# Patient Record
Sex: Female | Born: 1970
Health system: Southern US, Community
[De-identification: ages and names within clinical notes are randomized; demographics above are authoritative.]

## PROBLEM LIST (undated history)

## (undated) DIAGNOSIS — R7303 Prediabetes: Secondary | ICD-10-CM

## (undated) DIAGNOSIS — I10 Essential (primary) hypertension: Secondary | ICD-10-CM

## (undated) DIAGNOSIS — M1990 Unspecified osteoarthritis, unspecified site: Secondary | ICD-10-CM

## (undated) DIAGNOSIS — F32A Depression, unspecified: Secondary | ICD-10-CM

## (undated) DIAGNOSIS — E785 Hyperlipidemia, unspecified: Secondary | ICD-10-CM

## (undated) DIAGNOSIS — T7840XA Allergy, unspecified, initial encounter: Secondary | ICD-10-CM

## (undated) HISTORY — PX: CARPAL TUNNEL RELEASE: SHX101

## (undated) HISTORY — DX: Allergy, unspecified, initial encounter: T78.40XA

## (undated) HISTORY — PX: COLONOSCOPY: SHX174

## (undated) HISTORY — PX: UPPER GASTROINTESTINAL ENDOSCOPY: SHX188

## (undated) HISTORY — DX: Depression, unspecified: F32.A

## (undated) HISTORY — DX: Hyperlipidemia, unspecified: E78.5

## (undated) HISTORY — PX: ABDOMINAL HYSTERECTOMY: SHX81

## (undated) HISTORY — PX: OTHER SURGICAL HISTORY: SHX169

---

## 2012-01-26 HISTORY — PX: TUBAL LIGATION: SHX77

## 2019-02-14 ENCOUNTER — Ambulatory Visit
Admission: EM | Admit: 2019-02-14 | Discharge: 2019-02-14 | Disposition: A | Discharge status: Home / Self Care | Payer: 59 | Source: Home / Self Care

## 2019-02-14 ENCOUNTER — Encounter (HOSPITAL_COMMUNITY): Payer: Self-pay | Admitting: Emergency Medicine

## 2019-02-14 ENCOUNTER — Other Ambulatory Visit: Payer: Self-pay

## 2019-02-14 ENCOUNTER — Emergency Department (HOSPITAL_COMMUNITY)
Admission: EM | Admit: 2019-02-14 | Discharge: 2019-02-14 | Disposition: A | Discharge status: Home / Self Care | Payer: 59 | Attending: Emergency Medicine | Admitting: Emergency Medicine

## 2019-02-14 DIAGNOSIS — B349 Viral infection, unspecified: Secondary | ICD-10-CM | POA: Diagnosis not present

## 2019-02-14 DIAGNOSIS — M2550 Pain in unspecified joint: Secondary | ICD-10-CM

## 2019-02-14 DIAGNOSIS — M791 Myalgia, unspecified site: Secondary | ICD-10-CM | POA: Diagnosis not present

## 2019-02-14 DIAGNOSIS — R509 Fever, unspecified: Secondary | ICD-10-CM

## 2019-02-14 DIAGNOSIS — R34 Anuria and oliguria: Secondary | ICD-10-CM

## 2019-02-14 DIAGNOSIS — Z79899 Other long term (current) drug therapy: Secondary | ICD-10-CM | POA: Diagnosis not present

## 2019-02-14 DIAGNOSIS — I10 Essential (primary) hypertension: Secondary | ICD-10-CM | POA: Insufficient documentation

## 2019-02-14 DIAGNOSIS — Z20822 Contact with and (suspected) exposure to covid-19: Secondary | ICD-10-CM | POA: Diagnosis not present

## 2019-02-14 DIAGNOSIS — R52 Pain, unspecified: Secondary | ICD-10-CM

## 2019-02-14 HISTORY — DX: Essential (primary) hypertension: I10

## 2019-02-14 LAB — CBC WITH DIFFERENTIAL/PLATELET
Abs Immature Granulocytes: 0.06 10*3/uL (ref 0.00–0.07)
Basophils Absolute: 0.1 10*3/uL (ref 0.0–0.1)
Basophils Relative: 1 %
Eosinophils Absolute: 0 10*3/uL (ref 0.0–0.5)
Eosinophils Relative: 0 %
HCT: 42.3 % (ref 36.0–46.0)
Hemoglobin: 13.7 g/dL (ref 12.0–15.0)
Immature Granulocytes: 1 %
Lymphocytes Relative: 8 %
Lymphs Abs: 0.8 10*3/uL (ref 0.7–4.0)
MCH: 23.9 pg — ABNORMAL LOW (ref 26.0–34.0)
MCHC: 32.4 g/dL (ref 30.0–36.0)
MCV: 73.7 fL — ABNORMAL LOW (ref 80.0–100.0)
Monocytes Absolute: 0.6 10*3/uL (ref 0.1–1.0)
Monocytes Relative: 5 %
Neutro Abs: 9.3 10*3/uL — ABNORMAL HIGH (ref 1.7–7.7)
Neutrophils Relative %: 85 %
Platelets: 253 10*3/uL (ref 150–400)
RBC: 5.74 MIL/uL — ABNORMAL HIGH (ref 3.87–5.11)
RDW: 18.5 % — ABNORMAL HIGH (ref 11.5–15.5)
WBC: 10.8 10*3/uL — ABNORMAL HIGH (ref 4.0–10.5)
nRBC: 0 % (ref 0.0–0.2)

## 2019-02-14 LAB — BASIC METABOLIC PANEL
Anion gap: 11 (ref 5–15)
BUN: 8 mg/dL (ref 6–20)
CO2: 27 mmol/L (ref 22–32)
Calcium: 9.1 mg/dL (ref 8.9–10.3)
Chloride: 97 mmol/L — ABNORMAL LOW (ref 98–111)
Creatinine, Ser: 0.89 mg/dL (ref 0.44–1.00)
GFR calc Af Amer: >60 mL/min (ref >60)
GFR calc non Af Amer: >60 mL/min (ref >60)
Glucose, Bld: 112 mg/dL — ABNORMAL HIGH (ref 70–99)
Potassium: 4.1 mmol/L (ref 3.5–5.1)
Sodium: 135 mmol/L (ref 135–145)

## 2019-02-14 LAB — URINALYSIS, ROUTINE W REFLEX MICROSCOPIC
Bilirubin Urine: NEGATIVE
Glucose, UA: NEGATIVE mg/dL
Hgb urine dipstick: NEGATIVE
Ketones, ur: NEGATIVE mg/dL
Leukocytes,Ua: NEGATIVE
Nitrite: NEGATIVE
Protein, ur: NEGATIVE mg/dL
Specific Gravity, Urine: 1.003 — ABNORMAL LOW (ref 1.005–1.030)
pH: 7 (ref 5.0–8.0)

## 2019-02-14 LAB — POC URINE PREG, ED: Preg Test, Ur: NEGATIVE

## 2019-02-14 LAB — POCT URINALYSIS DIP (MANUAL ENTRY)
Bilirubin, UA: NEGATIVE
Blood, UA: NEGATIVE
Glucose, UA: NEGATIVE mg/dL
Ketones, POC UA: NEGATIVE mg/dL
Leukocytes, UA: NEGATIVE
Nitrite, UA: NEGATIVE
Protein Ur, POC: NEGATIVE mg/dL
Spec Grav, UA: 1.015 (ref 1.010–1.025)
Urobilinogen, UA: 0.2 E.U./dL
pH, UA: 6 (ref 5.0–8.0)

## 2019-02-14 LAB — LIPASE, BLOOD: Lipase: 23 U/L (ref 11–51)

## 2019-02-14 LAB — SARS CORONAVIRUS 2 (TAT 6-24 HRS): SARS Coronavirus 2: NEGATIVE

## 2019-02-14 LAB — LACTIC ACID, PLASMA: Lactic Acid, Venous: 2.3 mmol/L (ref 0.5–1.9)

## 2019-02-14 MED ORDER — ACETAMINOPHEN 325 MG PO TABS
975.0000 mg | ORAL_TABLET | Freq: Once | ORAL | Status: AC
  Administered 2019-02-14: 13:00:00 975 mg via ORAL

## 2019-02-14 MED ORDER — SODIUM CHLORIDE 0.9 % IV BOLUS
1000.0000 mL | Freq: Once | INTRAVENOUS | Status: AC
  Administered 2019-02-14: 1000 mL via INTRAVENOUS

## 2019-02-14 MED ORDER — ACETAMINOPHEN 500 MG PO TABS
1000.0000 mg | ORAL_TABLET | Freq: Once | ORAL | Status: AC
  Administered 2019-02-14: 1000 mg via ORAL
  Filled 2019-02-14: qty 2

## 2019-02-14 NOTE — ED Provider Notes (Signed)
[facility] DEPT Provider Note   [name]: [id number] Arrival date & time: 02/14/19  1402     History Chief Complaint  Patient presents with  . Fever    sent from Urgent care  . Joint Pain    Yaneisy Wenz is a 49 y.o. female.  The history is provided by the patient and medical records. No language interpreter was used.  Fever    49 year old female with history of ovarian cyst presenting to ED for evaluation of fever.  Patient report for the past 3 days she has noticed strong urine odor without urinary frequency urgency or burning on urination.  She also endorse having fever, body aches, diffuse joint pain.  She mentioned having urinary tract infection last month treated with Bactrim with resolution of her symptoms.  She has a hysterectomy however does have an intact left ovary and has history of ovarian cyst and was taking a very expensive medication that cost roughly $1000 however insurance does not cover so she no longer take medication.  States the medication is primarily to help with vaginal bleeding.  She denies any active vaginal bleeding.  She denies any headache, runny nose sneezing coughing chest pain shortness of breath abdominal pain back pain or vaginal discharge.  She went to urgent care center earlier today for evaluation but was sent to the ER for further evaluation.  She did take Tylenol prior to arrival.  No report of any rash.  Past Medical History:  Diagnosis Date  . Hypertension     There are no problems to display for this patient.   History reviewed. No pertinent surgical history.   OB History   No obstetric history on file.     No family history on file.  Social History   Tobacco Use  . Smoking status: Never Smoker  . Smokeless tobacco: Never Used  Substance Use Topics  . Alcohol use: Not Currently  . Drug use: Not Currently    Home Medications Prior to Admission medications   Medication Sig Start Date End Date  Taking? Authorizing Provider  amLODipine (NORVASC) 10 MG tablet Take 10 mg by mouth daily.    [provider]  hydrochlorothiazide (HYDRODIURIL) 12.5 MG tablet Take 12.5 mg by mouth daily.    [provider]  sertraline (ZOLOFT) 50 MG tablet Take 50 mg by mouth at bedtime.    [provider]    Allergies    Patient has no known allergies.  Review of Systems   Review of Systems  Constitutional: Positive for fever.  All other systems reviewed and are negative.   Physical Exam Updated Vital Signs BP (!) 148/97 (BP Location: Right Arm)   Pulse (!) 107   Temp 99.9 F (37.7 C) (Oral)   Resp 20   SpO2 97%   Physical Exam Vitals and nursing note reviewed.  Constitutional:      General: She is not in acute distress.    Appearance: She is well-developed.  HENT:     Head: Atraumatic.  Eyes:     Conjunctiva/sclera: Conjunctivae normal.  Cardiovascular:     Rate and Rhythm: Tachycardia present.     Pulses: Normal pulses.     Heart sounds: Normal heart sounds.  Pulmonary:     Effort: Pulmonary effort is normal.     Breath sounds: Normal breath sounds. No wheezing, rhonchi or rales.  Abdominal:     Palpations: Abdomen is soft.     Tenderness: There is no abdominal  tenderness.  Musculoskeletal:        General: No tenderness.     Cervical back: Neck supple.     Comments: Inspection of major joints without any overlying skin changes or restrictions of movement.  Skin:    Findings: No rash.  Neurological:     Mental Status: She is alert. Mental status is at baseline.  Psychiatric:        Mood and Affect: Mood normal.     ED Results / Procedures / Treatments   Labs (all labs ordered are listed, but only abnormal results are displayed) Labs Reviewed - No data to display  EKG None  Radiology No results found.  Procedures Procedures (including critical care time)  Medications Ordered in ED Medications - No data to display  ED Course  I  have reviewed the triage vital signs and the nursing notes.  Pertinent labs & imaging results that were available during my care of the patient were reviewed by me and considered in my medical decision making (see chart for details).    MDM Rules/Calculators/A&P                      BP 132/87   Pulse 94   Temp 99.9 F (37.7 C) (Oral)   Resp 18   SpO2 100%   Final Clinical Impression(s) / ED Diagnoses Final diagnoses:  None    Rx / DC Orders ED Discharge Orders    None     2:53 PM Patient here with fever, and strong urine odor.  Sent here from urgent care for evaluation.  Patient denies any abdominal pain vaginal discharge or concerns for STI as she denies any new sexual partner.  She is overall well-appearing.  No COVID-19 symptoms.  Will obtain UA, work-up initiated.  3:10 PM Pt sign out to oncoming provider who will f/u on labs, reassess and determine disposition.    Domenic Moras, PA-C 02/14/19 1510    Hayden Rasmussen, MD 02/14/19 [phone]

## 2019-02-14 NOTE — Discharge Instructions (Signed)
You have been diagnosed today with viral illness, fever, body aches, Covid test.  At this time there does not appear to be the presence of an emergent medical condition, however there is always the potential for conditions to change. Please read and follow the below instructions.  Please return to the Emergency Department immediately for any new or worsening symptoms. Please be sure to follow up with your Primary Care Provider within one week regarding your visit today; please call their office to schedule an appointment even if you are feeling better for a follow-up visit. Please drink plenty of water and get plenty of rest.  You may use over-the-counter anti-inflammatory such as Tylenol as directed on the packaging to help with fever and body aches. Your Covid test is pending, you may check your results on your MyChart account in 1-2 days.  Please discuss the results with your primary care provider at your follow-up visit within 1 week.  Please continue to isolate to avoid spread of any illness.  There is a possibility of a false negative test so it is important to continue to isolate until your symptom-free for 7 days.  Return to the ER immediately for any new or worsening symptoms.  Get help right away if: You are short of breath or have trouble breathing. You are dizzy or you pass out (faint). You feel mixed up (confused). You have signs of not having enough water in your body, such as: Dark pee, very little pee, or no pee. Cracked lips. Dry mouth. Sunken eyes. Sleepiness. Weakness. You have very bad pain in your belly (abdomen). You keep throwing up or having watery poop. You have a rash on your skin. You have chest pain You have belly pain You have a severe headache or a stiff neck. You have severe vomiting or abdominal pain. You have any new/concerning or worsening of symptoms  Please read the additional information packets attached to your discharge summary.  Do not take your  medicine if  develop an itchy rash, swelling in your mouth or lips, or difficulty breathing; call 911 and seek immediate emergency medical attention if this occurs.  Note: Portions of this text may have been transcribed using voice recognition software. Every effort was made to ensure accuracy; however, inadvertent computerized transcription errors may still be present.

## 2019-02-14 NOTE — ED Notes (Signed)
Date and time results received: 02/14/19 3:57 PM (use smartphrase ".now" to insert current time)  Test: Lactic Acid 2.3 Critical Value: 2.3  Name of Provider Notified: EDP Pickering  Orders Received? Or Actions Taken?: See orders

## 2019-02-14 NOTE — ED Provider Notes (Signed)
Care handoff received from Domenic Moras PA-C at shift change please see his note for full details.  Patient with history of ovarian cyst presents today for 3 days of increased urine odor, frequency and dysuria, now with fever/body aches and arthralgias.  Reports UTI treated with Bactrim last month with resolution of symptoms.  Sent in from urgent care for evaluation.  No abdominal pain, vaginal discharge or concerns for STI.  No Covid symptoms.  CBC shows mild leukocytosis of 10.8, nonacute  Remaining lab work BMP, urinalysis, rapid Covid, urine pregnancy and lactic acid.  Patient given Tylenol and IV fluid.  Plan of care is to follow-up on labs, reassess. Physical Exam  BP 132/87   Pulse 94   Temp 99.9 F (37.7 C) (Oral)   Resp 18   SpO2 100%   Physical Exam Constitutional:      General: She is not in acute distress.    Appearance: Normal appearance. She is well-developed. She is not ill-appearing or diaphoretic.  HENT:     Head: Normocephalic and atraumatic.     Right Ear: External ear normal.     Left Ear: External ear normal.     Nose: Nose normal.  Eyes:     General: Vision grossly intact. Gaze aligned appropriately.     Pupils: Pupils are equal, round, and reactive to light.  Neck:     Trachea: Trachea and phonation normal. No tracheal deviation.  Pulmonary:     Effort: Pulmonary effort is normal. No respiratory distress.  Abdominal:     General: There is no distension.     Palpations: Abdomen is soft.     Tenderness: There is no abdominal tenderness. There is no guarding or rebound.  Genitourinary:    Comments: Deferred by patient Musculoskeletal:        General: Normal range of motion.     Cervical back: Full passive range of motion without pain, normal range of motion and neck supple.  Skin:    General: Skin is warm and dry.  Neurological:     Mental Status: She is alert.     GCS: GCS eye subscore is 4. GCS verbal subscore is 5. GCS motor subscore is 6.   Comments: Speech is clear and goal oriented, follows commands Major Cranial nerves without deficit, no facial droop Moves extremities without ataxia, coordination intact  Psychiatric:        Behavior: Behavior normal.     ED Course/Procedures     Procedures  MDM  Lactic acid 2.3 BMP nonacute Urinalysis negative for infection Lipase within normal limits Urine pregnancy test negative Send out Covid test pending - 4:00 PM: Patient evaluated resting comfortably no acute distress reports she is feeling improved since arrival has no current complaints denies any pain.  Vital signs stable on room air.  Fluid bolus running. - Suspect patient with viral illness today as etiology of her fever and arthralgias.  On reevaluation she is resting comfortably reports she is feeling well she has no current complaints.  Urinalysis does not show evidence of infection, additionally she denies any vaginal complaints and/or concern for STI, doubt PID at this time, she deferred pelvic examination.  Cranial nerves intact, no meningeal signs, airway clear, no respiratory complaints, no chest pain or shortness of breath, no abdominal pain/tenderness, no nausea/vomiting or diarrhea, no rashes or swelling.  After fluid bolus vital signs stable, no tachycardia, tachypnea, hypotension or hypoxia on room air.  Her Covid test is pending and she will  follow-up via her MyChart account and discuss results with her PCP at her follow-up visit.  Given pandemic it is possible that COVID-19 is etiology of her fever and soreness today, have encouraged patient to continue to isolate and follow-up with PCP this week for recheck.  Encouraged water rehydration and rest.  Patient is well-appearing, does not appear septic. Doubt pneumonia, meningitis, PID or other emergent pathologies requiring antibiotics or further work-up in the ER at this time.  Work note given.  At this time there does not appear to be any evidence of an acute  emergency medical condition and the patient appears stable for discharge with appropriate outpatient follow up. Diagnosis was discussed with patient who verbalizes understanding of care plan and is agreeable to discharge. I have discussed return precautions with patient who verbalizes understanding of return precautions. Patient encouraged to follow-up with their PCP. All questions answered.  Patient's case discussed with Dr. Alvino Chapel who agrees with plan to discharge with follow-up.   Pamela Simon was evaluated in Emergency Department on 02/14/2019 for the symptoms described in the history of present illness. She was evaluated in the context of the global COVID-19 pandemic, which necessitated consideration that the patient might be at risk for infection with the SARS-CoV-2 virus that causes COVID-19. Institutional protocols and algorithms that pertain to the evaluation of patients at risk for COVID-19 are in a state of rapid change based on information released by regulatory bodies including the CDC and federal and state organizations. These policies and algorithms were followed during the patient's care in the ED.   Note: Portions of this report may have been transcribed using voice recognition software. Every effort was made to ensure accuracy; however, inadvertent computerized transcription errors may still be present.   Gari Crown 02/14/19 Kandee Keen, MD 02/14/19 [phone]

## 2019-02-14 NOTE — Discharge Instructions (Signed)
49 year old female presenting for 3 days of fever, arthralgias.  Patient is 2 weeks status post UTI treatment with Bactrim.  Patient reported improvement of symptoms thereof.  Has had decreased urination since today.  Does endorse darkened urine.  Urine dipstick unremarkable in office today.  Patient febrile, improved with Tylenol 939m which was administered at 12:30 PM.  Patient without obvious source of infection at this time: Referred to ER for further evaluation/management.

## 2019-02-14 NOTE — ED Triage Notes (Signed)
Pt c/o UTI sx's. States tx'd for UTI 2wks ago with antibiotics and sx's went away. States a week ago having dark urine with an odor. Pt c/o joint pain to hands, feet, knees and elbows x3 days. Pt has temp 101.9 on arrival.

## 2019-02-14 NOTE — ED Triage Notes (Signed)
Pt thought on way from UC and has a cyst on her ovary  That she was taking medication for that she has been out since December due to cost $1000 and insurance not covering.  Pt having joint pains and strong odor in urine as to why was seen at UC. Had fever there that was unaware that she had. Was advised to go to ED for further evaluation.

## 2019-02-14 NOTE — ED Provider Notes (Signed)
[name]    [name]: [id number] Arrival date & time: 02/14/19  1212      History   Chief Complaint Chief Complaint  Patient presents with  . Fever    HPI Pamela Simon is a 49 y.o. female with history of obesity, hypertension presenting for fever, arthralgias for the last 3 days.  Patient states 2 weeks ago she was treated for UTI with Macrobid: Had resolution of symptoms.  Patient states 1 week ago she began to have dark urine with an odor.  Patient states joint pain is diffuse through bilateral hands, feet, knees, elbows.  No neck rigidity, back pain, change in bowel habit.  Patient endorsing inability to urinate this morning: Last urination was 11 AM: Patient reports consuming greater than 40 ounces at time of visit.  Denies history of renal calculi, pyelonephritis.  No pelvic pain, abdominal pain.  Denies known sick contacts, cough, shortness of breath, nausea, vomiting.  Reports good oral intake.  Patient denies chest pain, palpitations, lower extremity edema.  Past Medical History:  Diagnosis Date  . Hypertension     There are no problems to display for this patient.   History reviewed. No pertinent surgical history.  OB History   No obstetric history on file.      Home Medications    Prior to Admission medications   Medication Sig Start Date End Date Taking? Authorizing Provider  amLODipine (NORVASC) 10 MG tablet Take 10 mg by mouth daily.   Yes [provider]  sertraline (ZOLOFT) 50 MG tablet Take 50 mg by mouth daily.    Yes [provider]  hydrochlorothiazide (HYDRODIURIL) 25 MG tablet Take 25 mg by mouth daily. 02/06/19   [provider]  NASCOBAL 500 MCG/0.1ML SOLN Place 1 spray into both nostrils once a week. 02/13/19   [provider]  Vitamin D, Ergocalciferol, (DRISDOL) 1.25 MG (50000 UNIT) CAPS capsule Take 50,000 Units by mouth once a week. 02/12/19   [provider]    Family History History  reviewed. No pertinent family history.  Social History Social History   Tobacco Use  . Smoking status: Never Smoker  . Smokeless tobacco: Never Used  Substance Use Topics  . Alcohol use: Not Currently  . Drug use: Not Currently     Allergies   Patient has no known allergies.   Review of Systems As per HPI   Physical Exam Triage Vital Signs ED Triage Vitals  Enc Vitals Group     BP      Pulse      Resp      Temp      Temp src      SpO2      Weight      Height      Head Circumference      Peak Flow      Pain Score      Pain Loc      Pain Edu?      Excl. in [place]?    No data found.  Updated Vital Signs BP (!) 142/92 (BP Location: Left Arm)   Pulse (!) 104   Temp 100.1 F (37.8 C) (Oral)   Resp 18   SpO2 97%   Visual Acuity Right Eye Distance:   Left Eye Distance:   Bilateral Distance:    Right Eye Near:   Left Eye Near:    Bilateral Near:     Physical Exam Constitutional:      General: She  is not in acute distress.    Appearance: She is obese. She is ill-appearing. She is not toxic-appearing.     Comments: Patient is clammy, appears uncomfortable  HENT:     Head: Normocephalic and atraumatic.     Right Ear: Tympanic membrane, ear canal and external ear normal.     Left Ear: Tympanic membrane, ear canal and external ear normal.     Nose: Nose normal.     Comments: Negative sinus tenderness bilaterally    Mouth/Throat:     Mouth: Mucous membranes are moist.     Pharynx: Oropharynx is clear. No oropharyngeal exudate or posterior oropharyngeal erythema.  Eyes:     General: No scleral icterus.    Conjunctiva/sclera: Conjunctivae normal.     Pupils: Pupils are equal, round, and reactive to light.  Neck:     Comments: Trachea midline, negative JVD Cardiovascular:     Rate and Rhythm: Normal rate and regular rhythm.     Heart sounds: No murmur. No gallop.      Comments: HR at bedside 97 Pulmonary:     Effort: Pulmonary effort is normal. No  respiratory distress.     Breath sounds: No wheezing or rales.  Abdominal:     General: Bowel sounds are normal. There is no distension.     Palpations: Abdomen is soft.     Tenderness: There is no abdominal tenderness. There is no right CVA tenderness, left CVA tenderness, guarding or rebound.  Musculoskeletal:        General: No tenderness.     Cervical back: No tenderness.     Right lower leg: No edema.     Left lower leg: No edema.     Comments: Joints appear to be nonedematous and without erythema or effusion, though diffusely tender.  Lymphadenopathy:     Cervical: No cervical adenopathy.  Skin:    Capillary Refill: Capillary refill takes less than 2 seconds.     Coloration: Skin is not jaundiced or pale.     Findings: No rash.  Neurological:     General: No focal deficit present.     Mental Status: She is alert and oriented to person, place, and time.      UC Treatments / Results  Labs (all labs ordered are listed, but only abnormal results are displayed) Labs Reviewed  POCT URINALYSIS DIP (MANUAL ENTRY) - Normal    EKG   Radiology No results found.  Procedures Procedures (including critical care time)  Medications Ordered in UC Medications  acetaminophen (TYLENOL) tablet 975 mg (975 mg Oral Given 02/14/19 1241)    Initial Impression / Assessment and Plan / UC Course  I have reviewed the triage vital signs and the nursing notes.  Pertinent labs & imaging results that were available during my care of the patient were reviewed by me and considered in my medical decision making (see chart for details).    I have reviewed the triage vital signs and the nursing notes.  All pertinent labs & imaging results that were available during my care of the patient were reviewed by me and considered in my medical decision making (see chart for details).  Patient was febrile, nontoxic, with mild tachycardia that normalized during urgent care visit.  Patient given Tylenol  in office which he tolerated well: Temperature improved to 100.1 Fahrenheit.  Patient remained in urgent care for 1.5 hours and was unable to produce urine despite receiving greater than 300 cc of water orally and reported  home intake of 40 ounces.  Urine dipstick done in office, reviewed by me: Unremarkable.  Culture deferred.  Low concern for now complicated UTI versus pyelonephritis versus PID.  Due to diffuse arthralgias, fever, patient is appearance and inability to urinate with history of previous infection, patient referred to ER for further evaluation.  Electing to self transport in stable condition. Final Clinical Impressions(s) / UC Diagnoses   Final diagnoses:  Fever, unspecified fever cause  Arthralgia, unspecified joint  Decreased urine output     Discharge Instructions     49 year old female presenting for 3 days of fever, arthralgias.  Patient is 2 weeks status post UTI treatment with Bactrim.  Patient reported improvement of symptoms thereof.  Has had decreased urination since today.  Does endorse darkened urine.  Urine dipstick unremarkable in office today.  Patient febrile, improved with Tylenol 943m which was administered at 12:30 PM.  Patient without obvious source of infection at this time: Referred to ER for further evaluation/management.    ED Prescriptions    None     PDMP not reviewed this encounter.   HNeldon McBBlairsville PVermont01/21/21 [phone]

## 2019-02-20 ENCOUNTER — Other Ambulatory Visit: Payer: Self-pay

## 2019-02-20 ENCOUNTER — Ambulatory Visit
Admission: EM | Admit: 2019-02-20 | Discharge: 2019-02-20 | Disposition: A | Discharge status: Home / Self Care | Payer: 59

## 2019-02-20 NOTE — ED Triage Notes (Signed)
Patient presents to [facility] for re-evaluation after being seen in the ER last week.  States she needs clearance to return to work.  Patient has note from ER stating she can return to work on 02/20/18.  Note also states "follow up with your primary doctor prior to returning".  Patient made aware of what we are able to do here, states she feels better and is not concerned about remaining symptoms, solely needed re-assessment for permission to return to work.  Discussed patient with APP who agreed with this RNs assessment of what we able to offer her, and patient decided not to continue with her visit.  States she will follow-up with [employer], or try to get established at a PCP.  This RN also encouraged her to see if the note she has from the ER would suffice for her to return to work.

## 2019-05-04 ENCOUNTER — Other Ambulatory Visit: Payer: Self-pay | Admitting: Obstetrics and Gynecology

## 2019-05-04 DIAGNOSIS — Z1231 Encounter for screening mammogram for malignant neoplasm of breast: Secondary | ICD-10-CM

## 2019-05-07 ENCOUNTER — Ambulatory Visit
Admission: RE | Admit: 2019-05-07 | Discharge: 2019-05-07 | Disposition: A | Discharge status: Home / Self Care | Payer: 59 | Source: Ambulatory Visit | Attending: Obstetrics and Gynecology | Admitting: Obstetrics and Gynecology

## 2019-05-07 ENCOUNTER — Other Ambulatory Visit: Payer: Self-pay

## 2019-05-07 DIAGNOSIS — Z1231 Encounter for screening mammogram for malignant neoplasm of breast: Secondary | ICD-10-CM

## 2019-06-20 ENCOUNTER — Other Ambulatory Visit: Payer: Self-pay | Admitting: Family Medicine

## 2019-06-20 ENCOUNTER — Ambulatory Visit
Admission: RE | Admit: 2019-06-20 | Discharge: 2019-06-20 | Disposition: A | Discharge status: Home / Self Care | Payer: 59 | Source: Ambulatory Visit | Attending: Family Medicine | Admitting: Family Medicine

## 2019-06-20 ENCOUNTER — Other Ambulatory Visit: Payer: Self-pay

## 2019-06-20 DIAGNOSIS — M25562 Pain in left knee: Secondary | ICD-10-CM

## 2019-06-20 DIAGNOSIS — G8929 Other chronic pain: Secondary | ICD-10-CM

## 2019-12-14 ENCOUNTER — Other Ambulatory Visit (HOSPITAL_COMMUNITY): Payer: Self-pay | Admitting: Surgery

## 2020-01-02 ENCOUNTER — Ambulatory Visit (HOSPITAL_COMMUNITY)
Admission: RE | Admit: 2020-01-02 | Discharge: 2020-01-02 | Disposition: A | Discharge status: Home / Self Care | Payer: 59 | Source: Ambulatory Visit | Attending: Surgery | Admitting: Surgery

## 2020-01-02 ENCOUNTER — Other Ambulatory Visit (HOSPITAL_COMMUNITY): Payer: Self-pay | Admitting: Surgery

## 2020-01-02 ENCOUNTER — Other Ambulatory Visit: Payer: Self-pay

## 2020-01-07 ENCOUNTER — Other Ambulatory Visit: Payer: Self-pay

## 2020-01-07 ENCOUNTER — Encounter: Payer: Self-pay | Admitting: Skilled Nursing Facility1

## 2020-01-07 ENCOUNTER — Encounter: Payer: 59 | Attending: Surgery | Admitting: Skilled Nursing Facility1

## 2020-01-07 DIAGNOSIS — E669 Obesity, unspecified: Secondary | ICD-10-CM | POA: Insufficient documentation

## 2020-01-07 NOTE — Progress Notes (Signed)
Nutrition Assessment for Bariatric Surgery Medical Nutrition Therapy Appt Start Time: 2:03 End Time: 3:00  Patient was seen on 01/07/2020 for Pre-Operative Nutrition Assessment. Letter of approval faxed to [facility] bariatric surgery program coordinator on 12/163/2021  Referral stated Supervised Weight Loss (SWL) visits needed: 0; will have a minimum of 1 more appointment to ensure pt has moved from contemplative stage of change to ready/action   Planned surgery: sleeve gastrectomy  Pt expectation of surgery: to lose weight Pt expectation of dietitian: to help educate     NUTRITION ASSESSMENT   Anthropometrics  Start weight at [facility]: 298.6 lbs (date: 01/07/2020)  Height: 64 in BMI: 51.25 kg/m2     Clinical  Medical hx: HTN, prediabetes Medications: see list Labs: pt stated 6.1 Notable signs/symptoms: blurry vision Any previous deficiencies? Vitamin B12, vitamin D  Micronutrient Nutrition Focused Physical Exam: Hair: No issues observed Eyes: No issues observed Mouth: No issues observed Neck: No issues observed Nails: No issues observed Skin: No issues observed  Lifestyle & Dietary Hx  Pt states when she is sad or anxious she eats.  Pt states she does not sleep well leading to inappropriate food choices.  Pt states she thinks she eats for comfort/security.    24-Hr Dietary Recall First Meal: 2 packets oatmeal + walnuts + raisins+ banana + applesauce or yogurt Snack:  Second Meal: tuna + peanut butter crackers or soup + crackers Snack: popcorn Third Meal: salmon, spinach, rice Snack: cookies Beverages: water, sweet tea, juice, coffee + cream, herbal tea + honey, wine, soda   Estimated Energy Needs Calories: 1600  NUTRITION DIAGNOSIS  Overweight/obesity ([place]-3.3) related to past poor dietary habits and physical inactivity as evidenced by patient w/ planned sleeve gastrectomy surgery following dietary guidelines for continued weight loss.    NUTRITION  INTERVENTION  Nutrition counseling (C-1) and education (E-2) to facilitate bariatric surgery goals.   Pre-Op Goals Reviewed with the Patient . Track food and beverage intake (pen and paper, MyFitness Pal, Baritastic app, etc.) . Make healthy food choices while monitoring portion sizes . Consume 3 meals per day or try to eat every 3-5 hours . Avoid concentrated sugars and fried foods: unsweet tea + splenda . Keep sugar & fat in the single digits per serving on food labels . Practice CHEWING your food (aim for applesauce consistency) . Practice not drinking 15 minutes before, during, and 30 minutes after each meal and snack . Avoid all carbonated beverages (ex: soda, sparkling beverages)  . Limit caffeinated beverages (ex: coffee, tea, energy drinks) . Avoid all sugar-sweetened beverages (ex: regular soda, sports drinks)  . Avoid alcohol  . Aim for 64-100 ounces of FLUID daily (with at least half of fluid intake being plain water)  . Aim for at least 60-80 grams of PROTEIN daily . Look for a liquid protein source that contains ?15 g protein and ?5 g carbohydrate (ex: shakes, drinks, shots) . Make a list of non-food related activities . Physical activity is an important part of a healthy lifestyle so keep it moving! The goal is to reach 150 minutes of exercise per week, including cardiovascular and weight baring activity.  *Goals that are bolded indicate the pt would like to start working towards these  Handouts Provided Include  . Bariatric Surgery handouts (Nutrition Visits, Pre-Op Goals, Protein Shakes, Vitamins & Minerals)  Learning Style & Readiness for Change Teaching method utilized: Visual & Auditory  Demonstrated degree of understanding via: Teach Back  Readiness Level: contemplating Barriers to  learning/adherence to lifestyle change: none identified   RD's Notes for Next Visit . Assess pts adherence to chosen goals     MONITORING & EVALUATION Dietary intake, weekly  physical activity, body weight, and pre-op goals reached at next nutrition visit.    Next Steps  Patient is to follow up at [facility] for Pre-Op Class >2 weeks before surgery for further nutrition education.

## 2020-01-23 ENCOUNTER — Ambulatory Visit (INDEPENDENT_AMBULATORY_CARE_PROVIDER_SITE_OTHER): Payer: 59 | Admitting: Psychology

## 2020-01-23 DIAGNOSIS — F509 Eating disorder, unspecified: Secondary | ICD-10-CM

## 2020-01-31 ENCOUNTER — Encounter: Payer: 59 | Attending: Surgery | Admitting: Skilled Nursing Facility1

## 2020-01-31 ENCOUNTER — Other Ambulatory Visit: Payer: Self-pay

## 2020-01-31 DIAGNOSIS — E669 Obesity, unspecified: Secondary | ICD-10-CM | POA: Diagnosis not present

## 2020-01-31 NOTE — Progress Notes (Signed)
Supervised Weight Loss Visit Bariatric Nutrition Education  Planned Surgery: sleeve  NUTRITION ASSESSMENT   Anthropometrics  Start weight at [facility]: 298.6 lbs (date: 01/07/2020)  Height: 64 in BMI: 51.25 kg/m2     Clinical  Medical hx: HTN, prediabetes Medications: see list Labs: pt stated 6.1 Notable signs/symptoms: blurry vision Any previous deficiencies? Vitamin B12, vitamin D  Lifestyle & Dietary Hx  Pt has made some really great changes and believes she has gained some great insight into her relationship with food.   Pt states she has only bought 2-3 soda has drank no sweet tea. Pt states she does well with not drinking with meals at work but at home it is more difficult. Pt state she is trying to be as realistic as possible. Pt states sleeping has never been good but has not been eating at night now. Pt states since not buying cookies she has not been comforting. Pt states she feel she has better control with her foods,   Estimated daily fluid intake: 64 oz Supplements: vitamin D Current average weekly physical activity: ADL's  24-Hr Dietary Recall First Meal: oatmeal + walnuts + craisins + 1 banana Snack: coffee + water Second Meal: tuna + crackers + fruit cup or soup Snack: candy cane Third Meal: chicken or salmon + spinach or green beans + mushrooms Snack:  Beverages: propel zero, water, coffee  Estimated Energy Needs Calories: 1500  NUTRITION DIAGNOSIS  Overweight/obesity ([place]-3.3) related to past poor dietary habits and physical inactivity as evidenced by patient w/ planned sleeve gastrectomy surgery following dietary guidelines for continued weight loss.   NUTRITION INTERVENTION  Nutrition counseling (C-1) and education (E-2) to facilitate bariatric surgery goals.  Pre-Op Goals Progress & New Goals . Avoid concentrated sugars and fried foods: unsweet tea + splenda . Practice not drinking 15 minutes before, during, and 30 minutes after each meal and  snack . Avoid all sugar-sweetened beverages (ex: regular soda, sports drinks)   Handouts Provided Include   N/A  Learning Style & Readiness for Change Teaching method utilized: Visual & Auditory  Demonstrated degree of understanding via: Teach Back  Readiness Level: Ready Barriers to learning/adherence to lifestyle change: none identified    MONITORING & EVALUATION Dietary intake, weekly physical activity, body weight, and pre-op goals in 1 month.   Next Steps  Patient is to return to [facility] for pre-op class

## 2020-02-04 ENCOUNTER — Ambulatory Visit (INDEPENDENT_AMBULATORY_CARE_PROVIDER_SITE_OTHER): Payer: 59 | Admitting: Psychology

## 2020-02-04 DIAGNOSIS — F509 Eating disorder, unspecified: Secondary | ICD-10-CM

## 2020-02-06 ENCOUNTER — Ambulatory Visit: Payer: 59 | Admitting: Psychology

## 2020-03-17 ENCOUNTER — Other Ambulatory Visit: Payer: Self-pay

## 2020-03-17 ENCOUNTER — Encounter: Payer: 59 | Attending: Surgery | Admitting: Skilled Nursing Facility1

## 2020-03-17 DIAGNOSIS — E669 Obesity, unspecified: Secondary | ICD-10-CM | POA: Insufficient documentation

## 2020-03-17 NOTE — Progress Notes (Signed)
Pre-Operative Nutrition Class:  Appt start time: 5615   End time:  1830.  Patient was seen on 03/17/2020 for Pre-Operative Bariatric Surgery Education at [facility].    Surgery date: 04/07/2020 Surgery type: sleeve Start weight at [facility]: 298.6 Weight today:  virtual appt: pt agreed to the limitations of this visit type and identified via name and DOB  The following the learning objectives were met by the patient during this course:  Identify Pre-Op Dietary Goals and will begin 2 weeks pre-operatively  Identify appropriate sources of fluids and proteins   State protein recommendations and appropriate sources pre and post-operatively  Identify Post-Operative Dietary Goals and will follow for 2 weeks post-operatively  Identify appropriate multivitamin and calcium sources  Describe the need for physical activity post-operatively and will follow MD recommendations  State when to call healthcare provider regarding medication questions or post-operative complications  Handouts given during class include:  Pre-Op Bariatric Surgery Diet Handout  Protein Shake Handout  Post-Op Bariatric Surgery Nutrition Handout  [facility]ELT Program Information Flyer  Support Group Information Flyer  [facility] Bariatric Supplements Price List  Follow-Up Plan: Patient will follow-up at [facility] 2 weeks post operatively for diet advancement per MD.

## 2020-03-24 ENCOUNTER — Telehealth: Payer: Self-pay | Admitting: Skilled Nursing Facility1

## 2020-03-24 NOTE — Telephone Encounter (Signed)
Correct  From: Rozell Searing <[email or web address]>  Sent: Monday, March 24, 2020 3:03 PM To: Dailah Opperman <[email or web address]> Subject: RE: handouts  *Caution - External email - see footer for warnings* Sooo no grapefruit two weeks b4 either correct? Sent from [employer] on [place], Mar 17, 2020 at 1:59 PM, Secondary school teacher, Renetta Suman <[email or web address]> wrote: No problem virtual makes everything confusing. No you can drink those just limit it to one or two bottles per day.    From: Rozell Searing <[email or web address]>  Sent: Monday, March 17, 2020 1:57 PM To: Nereida Schepp <[email or web address]> Subject: Re: handouts   *Caution - External email - see footer for warnings* My complete apologies.Marland KitchenMarland KitchenI'm slow this morning! :-)  I do indeed have those. Now I'm all set!! See you soon...   Oh, I bought some Propel Immune Support...but they do not say Propel Zero. Says it has 5 calories instead of 0...should I return those for my regular Zero?   Joelene Millin   On Thursday, March 13, 2020, 07:10:34 PM EST, Hence Derrick <[email or web address]> wrote:      Hello,     Attached you will find the handouts for class this coming Monday at 8:15am.     Thank You, Sandie Ano MS, RD, CSOWM, LDN [place]  [facility]

## 2020-03-28 NOTE — Patient Instructions (Addendum)
DUE TO COVID-19 ONLY ONE VISITOR IS ALLOWED TO COME WITH YOU AND STAY IN THE WAITING ROOM ONLY DURING PRE OP AND PROCEDURE DAY OF SURGERY. THE 2 VISITORS  MAY VISIT WITH YOU AFTER SURGERY IN YOUR PRIVATE ROOM DURING VISITING HOURS ONLY!  YOU NEED TO HAVE A COVID 19 TEST ON__3/10_____ @_2 :55______, THIS TEST MUST BE DONE BEFORE SURGERY,  COVID TESTING SITE [place] [id number], IT IS ON THE RIGHT GOING OUT [address] APPROXIMATELY  2 MINUTES PAST [employer] ON THE RIGHT. ONCE YOUR COVID TEST IS COMPLETED,  PLEASE BEGIN THE QUARANTINE INSTRUCTIONS AS OUTLINED IN YOUR HANDOUT.                Pamela Simon   Your procedure is scheduled on: 04/07/20   Report to [facility] Main  Entrance   Report to Short stay at 5:30 AM     Call this number if you have problems the morning of surgery [place], NO CHEWING GUM [place].    NO SOLID FOOD AFTER 6:00 PM THE NIGHT BEFORE YOUR SURGERY.   YOU MAY DRINK CLEAR FLUIDS until 4:30 AM  . PAIN IS EXPECTED AFTER SURGERY AND WILL NOT BE COMPLETELY ELIMINATED.  AMBULATION AND TYLENOL WILL HELP REDUCE INCISIONAL AND GAS PAIN. MOVEMENT IS KEY!  YOU ARE EXPECTED TO BE OUT OF BED WITHIN 4 HOURS OF ADMISSION TO YOUR PATIENT ROOM.  SITTING IN THE RECLINER THROUGHOUT THE DAY IS IMPORTANT FOR DRINKING FLUIDS AND MOVING GAS THROUGHOUT THE GI TRACT.  COMPRESSION STOCKINGS SHOULD BE WORN [place] UNLESS YOU ARE WALKING.   INCENTIVE SPIROMETER SHOULD BE USED EVERY HOUR WHILE AWAKE TO DECREASE POST-OPERATIVE COMPLICATIONS SUCH AS PNEUMONIA.  WHEN DISCHARGED HOME, IT IS IMPORTANT TO CONTINUE TO WALK EVERY HOUR AND USE THE INCENTIVE SPIROMETER EVERY HOUR.          Take these medicines the morning of surgery with A SIP OF WATER: Lexapro, Amlodipine                                 You may not have any metal on your  body including hair pins and              piercings  Do not wear jewelry, make-up, lotions, powders or perfumes, deodorant             Do not wear nail polish on your fingernails.  Do not shave  48 hours prior to surgery.                 Do not bring valuables to the hospital. [place].  Contacts, dentures or bridgework may not be worn into surgery.      [place] - Preparing for Surgery Before surgery, you can play an important role.  Because skin is not sterile, your skin needs to be as free of germs as possible.  You can reduce the number of germs on your skin by washing with CHG (chlorahexidine gluconate) soap before surgery.  CHG is an antiseptic cleaner which kills germs and bonds with the skin to continue killing germs even after washing. Please DO NOT use if you have an allergy to CHG or  antibacterial soaps.  If your skin becomes reddened/irritated stop using the CHG and inform your nurse when you arrive at Short Stay. Do not shave (including legs and underarms) for at least 48 hours prior to the first CHG shower.    Please follow these instructions carefully:  1.  Shower with CHG Soap the night before surgery and the  morning of Surgery.  2.  If you choose to wash your hair, wash your hair first as usual with your  normal  shampoo.  3.  After you shampoo, rinse your hair and body thoroughly to remove the  shampoo.                                        4.  Use CHG as you would any other liquid soap.  You can apply chg directly  to the skin and wash                       Gently with a scrungie or clean washcloth.  5.  Apply the CHG Soap to your body ONLY FROM THE NECK DOWN.   Do not use on face/ open                           Wound or open sores. Avoid contact with eyes, ears mouth and genitals (private parts).                       Wash face,  Genitals (private parts) with your normal soap.             6.  Wash thoroughly, paying  special attention to the area where your surgery  will be performed.  7.  Thoroughly rinse your body with warm water from the neck down.  8.  DO NOT shower/wash with your normal soap after using and rinsing off  the CHG Soap.             9.  Pat yourself dry with a clean towel.            10.  Wear clean pajamas.            11.  Place clean sheets on your bed the night of your first shower and do not  sleep with pets. Day of Surgery : Do not apply any lotions/deodorants the morning of surgery.  Please wear clean clothes to the hospital/surgery center.  FAILURE TO FOLLOW THESE INSTRUCTIONS MAY RESULT IN THE CANCELLATION OF YOUR SURGERY PATIENT SIGNATURE_________________________________  NURSE SIGNATURE__________________________________  ________________________________________________________________________

## 2020-03-31 ENCOUNTER — Encounter (HOSPITAL_COMMUNITY)
Admission: RE | Admit: 2020-03-31 | Discharge: 2020-03-31 | Disposition: A | Discharge status: Home / Self Care | Payer: 59 | Source: Ambulatory Visit | Attending: Surgery | Admitting: Surgery

## 2020-03-31 ENCOUNTER — Telehealth: Payer: Self-pay | Admitting: Skilled Nursing Facility1

## 2020-03-31 ENCOUNTER — Encounter (HOSPITAL_COMMUNITY): Payer: Self-pay

## 2020-03-31 ENCOUNTER — Other Ambulatory Visit: Payer: Self-pay

## 2020-03-31 DIAGNOSIS — Z01812 Encounter for preprocedural laboratory examination: Secondary | ICD-10-CM | POA: Diagnosis not present

## 2020-03-31 HISTORY — DX: Prediabetes: R73.03

## 2020-03-31 HISTORY — DX: Unspecified osteoarthritis, unspecified site: M19.90

## 2020-03-31 LAB — COMPREHENSIVE METABOLIC PANEL
ALT: 48 U/L — ABNORMAL HIGH (ref 0–44)
AST: 33 U/L (ref 15–41)
Albumin: 4.3 g/dL (ref 3.5–5.0)
Alkaline Phosphatase: 70 U/L (ref 38–126)
Anion gap: 13 (ref 5–15)
BUN: 13 mg/dL (ref 6–20)
CO2: 24 mmol/L (ref 22–32)
Calcium: 9.4 mg/dL (ref 8.9–10.3)
Chloride: 105 mmol/L (ref 98–111)
Creatinine, Ser: 0.8 mg/dL (ref 0.44–1.00)
GFR, Estimated: >60 mL/min (ref >60)
Glucose, Bld: 112 mg/dL — ABNORMAL HIGH (ref 70–99)
Potassium: 4 mmol/L (ref 3.5–5.1)
Sodium: 142 mmol/L (ref 135–145)
Total Bilirubin: 0.6 mg/dL (ref 0.3–1.2)
Total Protein: 7.8 g/dL (ref 6.5–8.1)

## 2020-03-31 LAB — CBC WITH DIFFERENTIAL/PLATELET
Abs Immature Granulocytes: 0.01 10*3/uL (ref 0.00–0.07)
Basophils Absolute: 0.1 10*3/uL (ref 0.0–0.1)
Basophils Relative: 1 %
Eosinophils Absolute: 0.1 10*3/uL (ref 0.0–0.5)
Eosinophils Relative: 3 %
HCT: 41 % (ref 36.0–46.0)
Hemoglobin: 13.3 g/dL (ref 12.0–15.0)
Immature Granulocytes: 0 %
Lymphocytes Relative: 34 %
Lymphs Abs: 1.5 10*3/uL (ref 0.7–4.0)
MCH: 24.3 pg — ABNORMAL LOW (ref 26.0–34.0)
MCHC: 32.4 g/dL (ref 30.0–36.0)
MCV: 74.8 fL — ABNORMAL LOW (ref 80.0–100.0)
Monocytes Absolute: 0.4 10*3/uL (ref 0.1–1.0)
Monocytes Relative: 9 %
Neutro Abs: 2.5 10*3/uL (ref 1.7–7.7)
Neutrophils Relative %: 53 %
Platelets: 241 10*3/uL (ref 150–400)
RBC: 5.48 MIL/uL — ABNORMAL HIGH (ref 3.87–5.11)
RDW: 16.9 % — ABNORMAL HIGH (ref 11.5–15.5)
WBC: 4.6 10*3/uL (ref 4.0–10.5)
nRBC: 0 % (ref 0.0–0.2)

## 2020-03-31 LAB — HEMOGLOBIN A1C
Hgb A1c MFr Bld: 6.2 % — ABNORMAL HIGH (ref 4.8–5.6)
Mean Plasma Glucose: 131.24 mg/dL

## 2020-03-31 NOTE — Progress Notes (Signed)
COVID Vaccine Completed:No Date COVID Vaccine completed: COVID vaccine manufacturer: [place]   PCP - Dt. Antony Blackbird Cardiologist - none  Chest x-ray - no EKG - 01/02/20-epic Stress Test - no ECHO - no Cardiac Cath - no Pacemaker/ICD device last checked:NA  Sleep Study - no CPAP -   Fasting Blood Sugar - NA Checks Blood Sugar _____ times a day  Blood Thinner Instructions:NA Aspirin Instructions: Last Dose:  Anesthesia review:   Patient denies shortness of breath, fever, cough and chest pain at PAT appointment yes  Patient verbalized understanding of instructions that were given to them at the PAT appointment. Patient was also instructed that they will need to review over the PAT instructions again at home before surgery.yes Pt is able to climb 2 flights of stairs, do housework and ADLs with out SOB

## 2020-03-31 NOTE — Telephone Encounter (Signed)
Pt emailed asking if she can have sweet potato for pre-op.  Dietitian answered no

## 2020-04-03 ENCOUNTER — Other Ambulatory Visit (HOSPITAL_COMMUNITY)
Admission: RE | Admit: 2020-04-03 | Discharge: 2020-04-03 | Disposition: A | Discharge status: Home / Self Care | Payer: 59 | Source: Ambulatory Visit | Attending: Surgery | Admitting: Surgery

## 2020-04-03 DIAGNOSIS — Z20822 Contact with and (suspected) exposure to covid-19: Secondary | ICD-10-CM | POA: Insufficient documentation

## 2020-04-03 DIAGNOSIS — Z01812 Encounter for preprocedural laboratory examination: Secondary | ICD-10-CM | POA: Diagnosis not present

## 2020-04-03 LAB — SARS CORONAVIRUS 2 (TAT 6-24 HRS): SARS Coronavirus 2: NEGATIVE

## 2020-04-06 MED ORDER — BUPIVACAINE LIPOSOME 1.3 % IJ SUSP
20.0000 mL | Freq: Once | INTRAMUSCULAR | Status: DC
  Filled 2020-04-06: qty 20

## 2020-04-06 NOTE — Anesthesia Preprocedure Evaluation (Addendum)
Anesthesia Evaluation  Patient identified by MRN, date of birth, ID band Patient awake    Reviewed: Allergy & Precautions, NPO status , Patient's Chart, lab work & pertinent test results  History of Anesthesia Complications Negative for: history of anesthetic complications  Airway Mallampati: III  TM Distance: >3 FB Neck ROM: Full    Dental  (+) Dental Advisory Given   Pulmonary neg pulmonary ROS,    Pulmonary exam normal        Cardiovascular hypertension, Pt. on medications Normal cardiovascular exam     Neuro/Psych PSYCHIATRIC DISORDERS Depression negative neurological ROS     GI/Hepatic negative GI ROS, Neg liver ROS,   Endo/Other  Morbid obesity  Renal/GU negative Renal ROS     Musculoskeletal negative musculoskeletal ROS (+)   Abdominal   Peds  Hematology negative hematology ROS (+)   Anesthesia Other Findings   Reproductive/Obstetrics                            Anesthesia Physical Anesthesia Plan  ASA: III  Anesthesia Plan: General   Post-op Pain Management:    Induction: Intravenous  PONV Risk Score and Plan: 4 or greater and Ondansetron, Dexamethasone, Midazolam and Scopolamine patch - Pre-op  Airway Management Planned: Oral ETT  Additional Equipment:   Intra-op Plan:   Post-operative Plan: Extubation in OR  Informed Consent: I have reviewed the patients History and Physical, chart, labs and discussed the procedure including the risks, benefits and alternatives for the proposed anesthesia with the patient or authorized representative who has indicated his/her understanding and acceptance.     Dental advisory given  Plan Discussed with: Anesthesiologist and CRNA  Anesthesia Plan Comments:        Anesthesia Quick Evaluation

## 2020-04-07 ENCOUNTER — Other Ambulatory Visit: Payer: Self-pay

## 2020-04-07 ENCOUNTER — Inpatient Hospital Stay (HOSPITAL_COMMUNITY): Payer: 59 | Admitting: Anesthesiology

## 2020-04-07 ENCOUNTER — Inpatient Hospital Stay (HOSPITAL_COMMUNITY)
Admission: RE | Admit: 2020-04-07 | Discharge: 2020-04-08 | DRG: 621 | Disposition: A | Discharge status: Home / Self Care | Payer: 59 | Attending: Surgery | Admitting: Surgery

## 2020-04-07 ENCOUNTER — Encounter (HOSPITAL_COMMUNITY)
Admission: RE | Disposition: A | Discharge status: Home / Self Care | Payer: Self-pay | Source: Home / Self Care | Attending: Surgery

## 2020-04-07 ENCOUNTER — Encounter (HOSPITAL_COMMUNITY): Payer: Self-pay | Admitting: Surgery

## 2020-04-07 DIAGNOSIS — Z6841 Body Mass Index (BMI) 40.0 and over, adult: Secondary | ICD-10-CM

## 2020-04-07 DIAGNOSIS — F32A Depression, unspecified: Secondary | ICD-10-CM | POA: Diagnosis present

## 2020-04-07 DIAGNOSIS — E785 Hyperlipidemia, unspecified: Secondary | ICD-10-CM | POA: Diagnosis present

## 2020-04-07 DIAGNOSIS — R7303 Prediabetes: Secondary | ICD-10-CM | POA: Diagnosis present

## 2020-04-07 DIAGNOSIS — K660 Peritoneal adhesions (postprocedural) (postinfection): Secondary | ICD-10-CM | POA: Diagnosis present

## 2020-04-07 DIAGNOSIS — Z9884 Bariatric surgery status: Secondary | ICD-10-CM

## 2020-04-07 DIAGNOSIS — E669 Obesity, unspecified: Secondary | ICD-10-CM | POA: Diagnosis present

## 2020-04-07 DIAGNOSIS — I10 Essential (primary) hypertension: Secondary | ICD-10-CM | POA: Diagnosis present

## 2020-04-07 HISTORY — PX: LAPAROSCOPIC GASTRIC SLEEVE RESECTION: SHX5895

## 2020-04-07 HISTORY — PX: UPPER GI ENDOSCOPY: SHX6162

## 2020-04-07 LAB — CBC
HCT: 37.6 % (ref 36.0–46.0)
Hemoglobin: 12.5 g/dL (ref 12.0–15.0)
MCH: 24.4 pg — ABNORMAL LOW (ref 26.0–34.0)
MCHC: 33.2 g/dL (ref 30.0–36.0)
MCV: 73.4 fL — ABNORMAL LOW (ref 80.0–100.0)
Platelets: 251 10*3/uL (ref 150–400)
RBC: 5.12 MIL/uL — ABNORMAL HIGH (ref 3.87–5.11)
RDW: 16.6 % — ABNORMAL HIGH (ref 11.5–15.5)
WBC: 13.1 10*3/uL — ABNORMAL HIGH (ref 4.0–10.5)
nRBC: 0 % (ref 0.0–0.2)

## 2020-04-07 LAB — CREATININE, SERUM
Creatinine, Ser: 0.75 mg/dL (ref 0.44–1.00)
GFR, Estimated: >60 mL/min (ref >60)

## 2020-04-07 LAB — TYPE AND SCREEN
ABO/RH(D): O POS
Antibody Screen: NEGATIVE

## 2020-04-07 LAB — HEMOGLOBIN AND HEMATOCRIT, BLOOD
HCT: 37.8 % (ref 36.0–46.0)
Hemoglobin: 12.3 g/dL (ref 12.0–15.0)

## 2020-04-07 LAB — ABO/RH: ABO/RH(D): O POS

## 2020-04-07 SURGERY — GASTRECTOMY, SLEEVE, LAPAROSCOPIC
Anesthesia: General | Site: Abdomen

## 2020-04-07 MED ORDER — DEXAMETHASONE SODIUM PHOSPHATE 10 MG/ML IJ SOLN
INTRAMUSCULAR | Status: DC | PRN
  Administered 2020-04-07: 10 mg via INTRAVENOUS

## 2020-04-07 MED ORDER — 0.9 % SODIUM CHLORIDE (POUR BTL) OPTIME
TOPICAL | Status: DC | PRN
  Administered 2020-04-07: 1000 mL

## 2020-04-07 MED ORDER — ACETAMINOPHEN 500 MG PO TABS
1000.0000 mg | ORAL_TABLET | ORAL | Status: AC
  Administered 2020-04-07: 1000 mg via ORAL

## 2020-04-07 MED ORDER — LACTATED RINGERS IV SOLN: INTRAVENOUS | Status: DC

## 2020-04-07 MED ORDER — SUGAMMADEX SODIUM 500 MG/5ML IV SOLN
INTRAVENOUS | Status: DC | PRN
  Administered 2020-04-07: 500 mg via INTRAVENOUS

## 2020-04-07 MED ORDER — ONDANSETRON HCL 4 MG/2ML IJ SOLN
INTRAMUSCULAR | Status: DC | PRN
  Administered 2020-04-07: 4 mg via INTRAVENOUS

## 2020-04-07 MED ORDER — PROPOFOL 10 MG/ML IV BOLUS
INTRAVENOUS | Status: DC | PRN
  Administered 2020-04-07: 200 mg via INTRAVENOUS

## 2020-04-07 MED ORDER — CHLORHEXIDINE GLUCONATE CLOTH 2 % EX PADS: 6.0000 | MEDICATED_PAD | Freq: Once | CUTANEOUS | Status: DC

## 2020-04-07 MED ORDER — ACETAMINOPHEN 160 MG/5ML PO SOLN: 1000.0000 mg | Freq: Three times a day (TID) | ORAL | Status: DC

## 2020-04-07 MED ORDER — FENTANYL CITRATE (PF) 100 MCG/2ML IJ SOLN
INTRAMUSCULAR | Status: AC
  Filled 2020-04-07: qty 2

## 2020-04-07 MED ORDER — BUPIVACAINE LIPOSOME 1.3 % IJ SUSP
INTRAMUSCULAR | Status: DC | PRN
  Administered 2020-04-07: 20 mL

## 2020-04-07 MED ORDER — PROPOFOL 10 MG/ML IV BOLUS
INTRAVENOUS | Status: AC
  Filled 2020-04-07: qty 40

## 2020-04-07 MED ORDER — SUGAMMADEX SODIUM 500 MG/5ML IV SOLN
INTRAVENOUS | Status: AC
  Filled 2020-04-07: qty 5

## 2020-04-07 MED ORDER — ORAL CARE MOUTH RINSE: 15.0000 mL | Freq: Once | OROMUCOSAL | Status: AC

## 2020-04-07 MED ORDER — ROCURONIUM BROMIDE 10 MG/ML (PF) SYRINGE
PREFILLED_SYRINGE | INTRAVENOUS | Status: AC
  Filled 2020-04-07: qty 10

## 2020-04-07 MED ORDER — KCL IN DEXTROSE-NACL 20-5-0.45 MEQ/L-%-% IV SOLN
INTRAVENOUS | Status: DC
  Filled 2020-04-07 (×2): qty 1000

## 2020-04-07 MED ORDER — OXYCODONE HCL 5 MG/5ML PO SOLN
5.0000 mg | Freq: Four times a day (QID) | ORAL | Status: DC | PRN
  Administered 2020-04-07 – 2020-04-08 (×3): 5 mg via ORAL
  Filled 2020-04-07 (×3): qty 5

## 2020-04-07 MED ORDER — EPHEDRINE SULFATE-NACL 50-0.9 MG/10ML-% IV SOSY
PREFILLED_SYRINGE | INTRAVENOUS | Status: DC | PRN
  Administered 2020-04-07: 10 mg via INTRAVENOUS

## 2020-04-07 MED ORDER — SCOPOLAMINE 1 MG/3DAYS TD PT72
1.0000 | MEDICATED_PATCH | TRANSDERMAL | Status: DC
  Filled 2020-04-07: qty 1

## 2020-04-07 MED ORDER — ACETAMINOPHEN 500 MG PO TABS
1000.0000 mg | ORAL_TABLET | Freq: Once | ORAL | Status: DC
  Filled 2020-04-07: qty 2

## 2020-04-07 MED ORDER — MIDAZOLAM HCL 2 MG/2ML IJ SOLN
INTRAMUSCULAR | Status: AC
  Filled 2020-04-07: qty 2

## 2020-04-07 MED ORDER — SCOPOLAMINE 1 MG/3DAYS TD PT72
1.0000 | MEDICATED_PATCH | TRANSDERMAL | Status: DC
  Administered 2020-04-07: 1.5 mg via TRANSDERMAL

## 2020-04-07 MED ORDER — AMISULPRIDE (ANTIEMETIC) 5 MG/2ML IV SOLN: 10.0000 mg | Freq: Once | INTRAVENOUS | Status: DC | PRN

## 2020-04-07 MED ORDER — SODIUM CHLORIDE (PF) 0.9 % IJ SOLN
INTRAMUSCULAR | Status: AC
  Filled 2020-04-07: qty 10

## 2020-04-07 MED ORDER — ENSURE MAX PROTEIN PO LIQD
2.0000 [oz_av] | ORAL | Status: DC
  Administered 2020-04-08 (×4): 2 [oz_av] via ORAL

## 2020-04-07 MED ORDER — PHENYLEPHRINE HCL-NACL 10-0.9 MG/250ML-% IV SOLN
INTRAVENOUS | Status: DC | PRN
  Administered 2020-04-07: 30 ug/min via INTRAVENOUS

## 2020-04-07 MED ORDER — HEPARIN SODIUM (PORCINE) 5000 UNIT/ML IJ SOLN
5000.0000 [IU] | INTRAMUSCULAR | Status: AC
  Administered 2020-04-07: 5000 [IU] via SUBCUTANEOUS
  Filled 2020-04-07: qty 1

## 2020-04-07 MED ORDER — MIDAZOLAM HCL 5 MG/5ML IJ SOLN
INTRAMUSCULAR | Status: DC | PRN
  Administered 2020-04-07: 2 mg via INTRAVENOUS

## 2020-04-07 MED ORDER — LIDOCAINE 2% (20 MG/ML) 5 ML SYRINGE
INTRAMUSCULAR | Status: AC
  Filled 2020-04-07: qty 5

## 2020-04-07 MED ORDER — KETAMINE HCL 10 MG/ML IJ SOLN
INTRAMUSCULAR | Status: AC
  Filled 2020-04-07: qty 1

## 2020-04-07 MED ORDER — DEXAMETHASONE SODIUM PHOSPHATE 10 MG/ML IJ SOLN
INTRAMUSCULAR | Status: AC
  Filled 2020-04-07: qty 1

## 2020-04-07 MED ORDER — APREPITANT 40 MG PO CAPS
40.0000 mg | ORAL_CAPSULE | ORAL | Status: AC
  Administered 2020-04-07: 40 mg via ORAL
  Filled 2020-04-07: qty 1

## 2020-04-07 MED ORDER — GLUCAGON HCL RDNA (DIAGNOSTIC) 1 MG IJ SOLR
INTRAMUSCULAR | Status: AC
  Filled 2020-04-07: qty 1

## 2020-04-07 MED ORDER — CELECOXIB 200 MG PO CAPS
200.0000 mg | ORAL_CAPSULE | Freq: Once | ORAL | Status: AC
  Administered 2020-04-07: 200 mg via ORAL
  Filled 2020-04-07: qty 1

## 2020-04-07 MED ORDER — ROCURONIUM BROMIDE 10 MG/ML (PF) SYRINGE
PREFILLED_SYRINGE | INTRAVENOUS | Status: DC | PRN
  Administered 2020-04-07: 100 mg via INTRAVENOUS

## 2020-04-07 MED ORDER — GABAPENTIN 300 MG PO CAPS
300.0000 mg | ORAL_CAPSULE | ORAL | Status: AC
  Administered 2020-04-07: 300 mg via ORAL
  Filled 2020-04-07: qty 1

## 2020-04-07 MED ORDER — ALBUMIN HUMAN 5 % IV SOLN
INTRAVENOUS | Status: AC
  Filled 2020-04-07: qty 250

## 2020-04-07 MED ORDER — PHENYLEPHRINE HCL (PRESSORS) 10 MG/ML IV SOLN
INTRAVENOUS | Status: AC
  Filled 2020-04-07: qty 1

## 2020-04-07 MED ORDER — ACETAMINOPHEN 500 MG PO TABS
1000.0000 mg | ORAL_TABLET | Freq: Three times a day (TID) | ORAL | Status: DC
  Administered 2020-04-07 – 2020-04-08 (×3): 1000 mg via ORAL
  Filled 2020-04-07 (×3): qty 2

## 2020-04-07 MED ORDER — GLUCAGON HCL RDNA (DIAGNOSTIC) 1 MG IJ SOLR
INTRAMUSCULAR | Status: DC | PRN
  Administered 2020-04-07: 1 mg via INTRAVENOUS

## 2020-04-07 MED ORDER — LACTATED RINGERS IR SOLN
Status: DC | PRN
  Administered 2020-04-07: 1000 mL

## 2020-04-07 MED ORDER — SODIUM CHLORIDE 0.9 % IV SOLN
2.0000 g | INTRAVENOUS | Status: AC
  Administered 2020-04-07: 2 g via INTRAVENOUS
  Filled 2020-04-07: qty 2

## 2020-04-07 MED ORDER — FENTANYL CITRATE (PF) 100 MCG/2ML IJ SOLN
INTRAMUSCULAR | Status: DC | PRN
  Administered 2020-04-07: 100 ug via INTRAVENOUS

## 2020-04-07 MED ORDER — ONDANSETRON HCL 4 MG/2ML IJ SOLN
INTRAMUSCULAR | Status: AC
  Filled 2020-04-07: qty 2

## 2020-04-07 MED ORDER — ALBUMIN HUMAN 5 % IV SOLN: INTRAVENOUS | Status: DC | PRN

## 2020-04-07 MED ORDER — FENTANYL CITRATE (PF) 100 MCG/2ML IJ SOLN
25.0000 ug | INTRAMUSCULAR | Status: DC | PRN
  Administered 2020-04-07 (×2): 50 ug via INTRAVENOUS

## 2020-04-07 MED ORDER — CHLORHEXIDINE GLUCONATE 0.12 % MT SOLN
15.0000 mL | Freq: Once | OROMUCOSAL | Status: AC
  Administered 2020-04-07: 15 mL via OROMUCOSAL

## 2020-04-07 MED ORDER — SIMETHICONE 80 MG PO CHEW
80.0000 mg | CHEWABLE_TABLET | Freq: Four times a day (QID) | ORAL | Status: DC | PRN
  Administered 2020-04-07: 80 mg via ORAL
  Filled 2020-04-07: qty 1

## 2020-04-07 MED ORDER — METOPROLOL TARTRATE 5 MG/5ML IV SOLN
5.0000 mg | Freq: Four times a day (QID) | INTRAVENOUS | Status: DC | PRN
  Filled 2020-04-07: qty 5

## 2020-04-07 MED ORDER — MORPHINE SULFATE (PF) 2 MG/ML IV SOLN: 1.0000 mg | INTRAVENOUS | Status: DC | PRN

## 2020-04-07 MED ORDER — KETAMINE HCL 10 MG/ML IJ SOLN
INTRAMUSCULAR | Status: DC | PRN
  Administered 2020-04-07 (×2): 15 mg via INTRAVENOUS

## 2020-04-07 MED ORDER — PANTOPRAZOLE SODIUM 40 MG IV SOLR
40.0000 mg | Freq: Every day | INTRAVENOUS | Status: DC
  Administered 2020-04-07: 40 mg via INTRAVENOUS
  Filled 2020-04-07: qty 40

## 2020-04-07 MED ORDER — LIDOCAINE HCL 2 % IJ SOLN
INTRAMUSCULAR | Status: AC
  Filled 2020-04-07: qty 20

## 2020-04-07 MED ORDER — LIDOCAINE 2% (20 MG/ML) 5 ML SYRINGE
INTRAMUSCULAR | Status: DC | PRN
  Administered 2020-04-07: 1.5 mg/kg/h via INTRAVENOUS
  Administered 2020-04-07: 100 mg via INTRAVENOUS

## 2020-04-07 MED ORDER — SODIUM CHLORIDE (PF) 0.9 % IJ SOLN
INTRAMUSCULAR | Status: DC | PRN
  Administered 2020-04-07: 10 mL

## 2020-04-07 MED ORDER — HEPARIN SODIUM (PORCINE) 5000 UNIT/ML IJ SOLN
5000.0000 [IU] | Freq: Three times a day (TID) | INTRAMUSCULAR | Status: DC
  Administered 2020-04-07 – 2020-04-08 (×3): 5000 [IU] via SUBCUTANEOUS
  Filled 2020-04-07 (×3): qty 1

## 2020-04-07 MED ORDER — ONDANSETRON HCL 4 MG/2ML IJ SOLN: 4.0000 mg | INTRAMUSCULAR | Status: DC | PRN

## 2020-04-07 SURGICAL SUPPLY — 59 items
APPLICATOR COTTON TIP 6 STRL (MISCELLANEOUS) IMPLANT
APPLICATOR COTTON TIP 6IN STRL (MISCELLANEOUS)
APPLIER CLIP 5 13 M/L LIGAMAX5 (MISCELLANEOUS)
APPLIER CLIP ROT 10 11.4 M/L (STAPLE)
APPLIER CLIP ROT 13.4 12 LRG (CLIP)
BLADE SURG 15 STRL LF DISP TIS (BLADE) ×2 IMPLANT
BLADE SURG 15 STRL SS (BLADE) ×1
CABLE HIGH FREQUENCY MONO STRZ (ELECTRODE) IMPLANT
CLIP APPLIE 5 13 M/L LIGAMAX5 (MISCELLANEOUS) IMPLANT
CLIP APPLIE ROT 10 11.4 M/L (STAPLE) IMPLANT
CLIP APPLIE ROT 13.4 12 LRG (CLIP) IMPLANT
COVER WAND RF STERILE (DRAPES) IMPLANT
DECANTER SPIKE VIAL GLASS SM (MISCELLANEOUS) ×3 IMPLANT
DERMABOND ADVANCED (GAUZE/BANDAGES/DRESSINGS) ×1
DERMABOND ADVANCED .7 DNX12 (GAUZE/BANDAGES/DRESSINGS) ×2 IMPLANT
DEVICE SUT QUICK LOAD TK 5 (STAPLE) IMPLANT
DEVICE SUT TI-KNOT TK 5X26 (MISCELLANEOUS) IMPLANT
DEVICE SUTURE ENDOST 10MM (ENDOMECHANICALS) IMPLANT
DISSECTOR BLUNT TIP ENDO 5MM (MISCELLANEOUS) IMPLANT
ELECT REM PT RETURN 15FT ADLT (MISCELLANEOUS) ×3 IMPLANT
GAUZE SPONGE 4X4 12PLY STRL (GAUZE/BANDAGES/DRESSINGS) IMPLANT
GLOVE BIOGEL M 8.0 STRL (GLOVE) ×3 IMPLANT
GOWN STRL REUS W/TWL XL LVL3 (GOWN DISPOSABLE) ×12 IMPLANT
GRASPER SUT TROCAR 14GX15 (MISCELLANEOUS) ×3 IMPLANT
HANDLE STAPLE EGIA 4 XL (STAPLE) ×3 IMPLANT
KIT BASIN OR (CUSTOM PROCEDURE TRAY) ×3 IMPLANT
KIT TURNOVER KIT A (KITS) ×3 IMPLANT
MARKER SKIN DUAL TIP RULER LAB (MISCELLANEOUS) ×3 IMPLANT
MAT PREVALON FULL STRYKER (MISCELLANEOUS) ×3 IMPLANT
NEEDLE SPNL 22GX3.5 QUINCKE BK (NEEDLE) ×3 IMPLANT
PACK CARDIOVASCULAR III (CUSTOM PROCEDURE TRAY) ×3 IMPLANT
RELOAD TRI 45 ART MED THCK BLK (STAPLE) ×3 IMPLANT
RELOAD TRI 45 ART MED THCK PUR (STAPLE) ×3 IMPLANT
RELOAD TRI 60 ART MED THCK BLK (STAPLE) ×3 IMPLANT
RELOAD TRI 60 ART MED THCK PUR (STAPLE) ×6 IMPLANT
SCISSORS LAP 5X45 EPIX DISP (ENDOMECHANICALS) IMPLANT
SET IRRIG TUBING LAPAROSCOPIC (IRRIGATION / IRRIGATOR) ×3 IMPLANT
SET TUBE SMOKE EVAC HIGH FLOW (TUBING) ×3 IMPLANT
SHEARS HARMONIC ACE PLUS 45CM (MISCELLANEOUS) ×3 IMPLANT
SLEEVE ADV FIXATION 5X100MM (TROCAR) ×6 IMPLANT
SLEEVE GASTRECTOMY 36FR VISIGI (MISCELLANEOUS) ×3 IMPLANT
SOL ANTI FOG 6CC (MISCELLANEOUS) ×2 IMPLANT
SOLUTION ANTI FOG 6CC (MISCELLANEOUS) ×1
SPONGE LAP 18X18 RF (DISPOSABLE) ×3 IMPLANT
STAPLER VISISTAT 35W (STAPLE) IMPLANT
SUT MNCRL AB 4-0 PS2 18 (SUTURE) ×6 IMPLANT
SUT SURGIDAC NAB ES-9 0 48 120 (SUTURE) IMPLANT
SUT VICRYL 0 TIES 12 18 (SUTURE) ×3 IMPLANT
SYR 10ML ECCENTRIC (SYRINGE) ×3 IMPLANT
SYR 20ML LL LF (SYRINGE) ×3 IMPLANT
TOWEL OR 17X26 10 PK STRL BLUE (TOWEL DISPOSABLE) ×3 IMPLANT
TOWEL OR NON WOVEN STRL DISP B (DISPOSABLE) ×3 IMPLANT
TROCAR ADV FIXATION 5X100MM (TROCAR) ×3 IMPLANT
TROCAR BLADELESS 15MM (ENDOMECHANICALS) ×3 IMPLANT
TROCAR BLADELESS OPT 5 100 (ENDOMECHANICALS) ×3 IMPLANT
TROCAR BLADELESS OPT 5 150 (ENDOMECHANICALS) IMPLANT
TUBE CALIBRATION LAPBAND (TUBING) IMPLANT
TUBING CONNECTING 10 (TUBING) ×3 IMPLANT
TUBING ENDO SMARTCAP (MISCELLANEOUS) ×3 IMPLANT

## 2020-04-07 NOTE — Progress Notes (Signed)
Discussed post op day goals with patient including ambulation, IS, diet progression, pain, and nausea control.  BSTOP education provided including BSTOP information guide, "Guide for Pain Management after your Bariatric Procedure".  Questions answered.

## 2020-04-07 NOTE — Progress Notes (Signed)
Patient has demonstrated incentive spirometer use. She walked from the stretcher to the chair and is currently sitting up. Only complains of pain 8/10 in her abdomen area. Patient has started water and verbalizes wanting to walk shortly after resting some.

## 2020-04-07 NOTE — Interval H&P Note (Signed)
History and Physical Interval Note:  04/07/2020 7:17 AM  Pamela Simon  has presented today for surgery, with the diagnosis of morbid obesity.  The various methods of treatment have been discussed with the patient and family. After consideration of risks, benefits and other options for treatment, the patient has consented to  Procedure(s): LAPAROSCOPIC GASTRIC SLEEVE RESECTION (N/A) UPPER GI ENDOSCOPY (N/A) as a surgical intervention.  The patient's history has been reviewed, patient examined, no change in status, stable for surgery.  I have reviewed the patient's chart and labs.  Questions were answered to the patient's satisfaction.     Pamela Simon

## 2020-04-07 NOTE — Progress Notes (Signed)
Patient has started protein.

## 2020-04-07 NOTE — Op Note (Signed)
Pamela Simon  [birth year]   04/07/2020    PCP:  Bartholome Bill, MD   Surgeon: Kaylyn Lim, MD, FACS  Asst:  Romana Juniper, MD, FACS  Anes:  general  Preop Dx: Morbid obesity  Postop Dx: same  Procedure: Laparoscopic sleeve gastrectomy with endoscopy Location Surgery: [facility] Complications: none  EBL:   minimal cc  Drains: none  Description of Procedure:  The patient was taken to OR 1 .  After anesthesia was administered and the patient was prepped  with chloroprep  and a timeout was performed.  Access to the abdomen was achieved with a 5 mm Optiview through the left upper quadrant.  5 trocars (5 mm) and one 15 mm trocar were placed.  She had lower midline incision with adhesions of small bowel which were in the lower midline.  The [place] exposed the upper abdomen.  She has a negative hx of GER and no hiatal hernia on UGI.  The short gastrics were taken down with the Harmonic.  The VISI G 36 was placed and the stomach was in spasm so glucagon was given.  The short gastrics were taken down with the harmonic.  We measured ~ 5 cm from the pylorus and began with 4.5 Covidien black load followed by a 6 cm black load with TRS and then purple loads with TRS.  The remnant stomach was removed from the 15 site and then it was closed with 0 vicryl and the PMI.    A TAP block with 30 cc of Exparel was placed at the beginning of the case.  Dr. Kae Heller scoped the patient and there was good symmetry and no bleeding or bubbles were seen.  The skin was closed with 4-0 Monocryl and Dermabond.   The patient tolerated the procedure well and was taken to the PACU in stable condition.     Matt B. Hassell Done, [place], [facility], [place]

## 2020-04-07 NOTE — Transfer of Care (Signed)
Immediate Anesthesia Transfer of Care Note  Patient: Pamela Simon  Procedure(s) Performed: LAPAROSCOPIC GASTRIC SLEEVE RESECTION (N/A Abdomen) UPPER GI ENDOSCOPY (N/A )  Patient Location: PACU  Anesthesia Type:General  Level of Consciousness: drowsy  Airway & Oxygen Therapy: Patient Spontanous Breathing and Patient connected to face mask oxygen  Post-op Assessment: Report given to RN and Post -op Vital signs reviewed and stable  Post vital signs: Reviewed and stable  Last Vitals:  Vitals Value Taken Time  BP 130/79 04/07/20 0915  Temp    Pulse 87 04/07/20 0918  Resp 27 04/07/20 0918  SpO2 95 % 04/07/20 0918  Vitals shown include unvalidated device data.  Last Pain:  Vitals:   04/07/20 0604  TempSrc: Oral  PainSc:          Complications: No complications documented.

## 2020-04-07 NOTE — Anesthesia Procedure Notes (Signed)
Procedure Name: Intubation Performed by: Cleda Daub, CRNA Pre-anesthesia Checklist: Patient identified, Emergency Drugs available, Suction available and Patient being monitored Patient Re-evaluated:Patient Re-evaluated prior to induction Oxygen Delivery Method: Circle system utilized Preoxygenation: Pre-oxygenation with 100% oxygen Induction Type: IV induction Ventilation: Mask ventilation without difficulty Laryngoscope Size: Mac and 4 Grade View: Grade I Tube type: Oral Tube size: 7.5 mm Number of attempts: 1 Airway Equipment and Method: Stylet and Oral airway Placement Confirmation: ETT inserted through vocal cords under direct vision,  positive ETCO2 and breath sounds checked- equal and bilateral Tube secured with: Tape Dental Injury: Teeth and Oropharynx as per pre-operative assessment

## 2020-04-07 NOTE — Anesthesia Postprocedure Evaluation (Signed)
Anesthesia Post Note  Patient: KAJAH SANTIZO  Procedure(s) Performed: LAPAROSCOPIC GASTRIC SLEEVE RESECTION (N/A Abdomen) UPPER GI ENDOSCOPY (N/A )     Patient location during evaluation: PACU Anesthesia Type: General Level of consciousness: sedated Pain management: pain level controlled Vital Signs Assessment: post-procedure vital signs reviewed and stable Respiratory status: spontaneous breathing and respiratory function stable Cardiovascular status: stable Postop Assessment: no apparent nausea or vomiting Anesthetic complications: no   No complications documented.  Last Vitals:  Vitals:   04/07/20 1000 04/07/20 1015  BP: 123/74 129/74  Pulse: 88 89  Resp: (!) 25 (!) 22  Temp:  36.6 C  SpO2: 95% 95%    Last Pain:  Vitals:   04/07/20 1015  TempSrc:   PainSc: 4                  Jeyla Bulger DANIEL

## 2020-04-07 NOTE — H&P (Signed)
Chief Complaint:  obesity  History of Present Illness:  Pamela Simon is an 50 y.o. female who has consented to sleeve gastrectomy.  No hx of reflux and her UGI: Normal upper GI series.    Past Medical History:  Diagnosis Date  . Arthritis    knees  . Depression   . Hyperlipidemia   . Hypertension   . Pre-diabetes     Past Surgical History:  Procedure Laterality Date  . CARPAL TUNNEL RELEASE    . partial hsyterectomy    . TUBAL LIGATION  2014    Current Facility-Administered Medications  Medication Dose Route Frequency Provider Last Rate Last Admin  . acetaminophen (TYLENOL) tablet 1,000 mg  1,000 mg Oral Once Duane Boston, MD      . bupivacaine liposome (EXPAREL) 1.3 % injection 266 mg  20 mL Infiltration Once Johnathan Hausen, MD      . cefoTEtan (CEFOTAN) 2 g in sodium chloride 0.9 % 100 mL IVPB  2 g Intravenous On Call to OR Johnathan Hausen, MD      . Chlorhexidine Gluconate Cloth 2 % PADS 6 each  6 each Topical Once Johnathan Hausen, MD       And  . Chlorhexidine Gluconate Cloth 2 % PADS 6 each  6 each Topical Once Johnathan Hausen, MD      . lactated ringers infusion   Intravenous Continuous Audry Pili, MD 10 mL/hr at 04/07/20 3976 Continued from Pre-op at 04/07/20 0613  . scopolamine (TRANSDERM-SCOP) 1 MG/3DAYS 1.5 mg  1 patch Transdermal On Call to OR Johnathan Hausen, MD   1.5 mg at 04/07/20 7341  . scopolamine (TRANSDERM-SCOP) 1 MG/3DAYS 1.5 mg  1 patch Transdermal Q72H Duane Boston, MD       Patient has no known allergies. History reviewed. No pertinent family history. Social History:   reports that she has never smoked. She has never used smokeless tobacco. She reports previous alcohol use. She reports previous drug use.   REVIEW OF SYSTEMS : Negative except for see above  Physical Exam:   Blood pressure (!) 151/98, pulse 73, temperature 98.3 F (36.8 C), temperature source Oral, resp. rate 16, SpO2 97 %. There is no height or weight on file to  calculate BMI.  Gen:  WDWN AAF NAD  Neurological: Alert and oriented to person, place, and time. Motor and sensory function is grossly intact  Head: Normocephalic and atraumatic.  Eyes: Conjunctivae are normal. Pupils are equal, round, and reactive to light. No scleral icterus.  Neck: Normal range of motion. Neck supple. No tracheal deviation or thyromegaly present.  Cardiovascular:  SR without murmurs or gallops.  No carotid bruits Breast:  Not examined Respiratory: Effort normal.  No respiratory distress. No chest wall tenderness. Breath sounds normal.  No wheezes, rales or rhonchi.  Abdomen:  Nontender; hysterectomy incision GU:  Not examined Musculoskeletal: Normal range of motion. Extremities are nontender. No cyanosis, edema or clubbing noted Lymphadenopathy: No cervical, preauricular, postauricular or axillary adenopathy is present Skin: Skin is warm and dry. No rash noted. No diaphoresis. No erythema. No pallor. Pscyh: Normal mood and affect. Behavior is normal. Judgment and thought content normal.   LABORATORY RESULTS: Results for orders placed or performed during the hospital encounter of 04/07/20 (from the past 48 hour(s))  ABO/Rh     Status: None   Collection Time: 04/07/20  5:55 AM  Result Value Ref Range   ABO/RH(D)      O POS Performed at [facility], [place] [address] [place], [place] [address]      RADIOLOGY RESULTS: No results found.  Problem List: Patient Active Problem List   Diagnosis Date Noted  . Obesity 04/07/2020    Assessment & Plan: Morbid obesity for sleeve gastrectomy    Matt B. Hassell Done, MD, [facility] [phone] beeper [phone]  04/07/2020 7:14 AM

## 2020-04-07 NOTE — Op Note (Signed)
Preoperative diagnosis: laparoscopic sleeve gastrectomy  Postoperative diagnosis: Same   Procedure: Upper endoscopy   Surgeon: Clovis Riley, M.D.  Anesthesia: Gen.   Description of procedure: The endoscope was placed in the mouth and oropharynx and under endoscopic vision it was advanced to the esophagogastric junction which was identified at 35cm from the teeth.  The pouch was tensely insufflated while the upper abdomen was flooded with irrigation to perform a leak test, which was negative. No bubbles were seen.  The staple line was hemostatic and the lumen was evenly tubular without undue narrowing, angulation or twisting specifically at the incisura angularis. The lumen was decompressed and the scope was withdrawn without difficulty.    Clovis Riley, M.D. General, Bariatric, & Minimally Invasive Surgery [facility]

## 2020-04-07 NOTE — Progress Notes (Signed)
PHARMACY CONSULT FOR:  Risk Assessment for Post-Discharge VTE Following Bariatric Surgery  Post-Discharge VTE Risk Assessment: This patient's probability of 30-day post-discharge VTE is increased due to the factors marked:   Female    Age >/=60 years  X  BMI >/=50 kg/m2    CHF    Dyspnea at Rest    Paraplegia  X  Non-gastric-band surgery    Operation Time >/=3 hr    Return to OR     Length of Stay >/= 3 d   Hx of VTE   Hypercoagulable condition   Significant venous stasis    Predicted probability of 30-day post-discharge VTE: 0.27%  Other patient-specific factors to consider: none  Recommendation for Discharge: No pharmacologic prophylaxis post-discharge  Pamela Simon is a 50 y.o. female who underwent laparoscopic sleeve gastrectomy 04/07/2020  No Known Allergies  Patient Measurements:   There is no height or weight on file to calculate BMI.  No results for input(s): WBC, HGB, HCT, PLT, APTT, CREATININE, LABCREA, CREATININE, CREAT24HRUR, MG, PHOS, ALBUMIN, PROT, ALBUMIN, AST, ALT, ALKPHOS, BILITOT, BILIDIR, IBILI in the last 72 hours. Estimated Creatinine Clearance: 116.2 mL/min (by C-G formula based on SCr of 0.8 mg/dL).    Past Medical History:  Diagnosis Date  . Arthritis    knees  . Depression   . Hyperlipidemia   . Hypertension   . Pre-diabetes      Medications Prior to Admission  Medication Sig Dispense Refill Last Dose  . amLODipine (NORVASC) 10 MG tablet Take 10 mg by mouth daily.   04/07/2020 at Unknown time  . Cholecalciferol (VITAMIN D3 PO) Take 1 tablet by mouth at bedtime.   Past Week at Unknown time  . escitalopram (LEXAPRO) 20 MG tablet Take 20 mg by mouth daily.   04/07/2020 at Unknown time  . fluticasone (FLONASE) 50 MCG/ACT nasal spray Place 2 sprays into both nostrils at bedtime as needed for allergies.   Past Week at Unknown time  . hydrochlorothiazide (HYDRODIURIL) 25 MG tablet Take 25 mg by mouth daily.   04/06/2020 at Unknown time  .  ketoconazole (NIZORAL) 2 % cream Apply 1 application topically daily as needed (skin irritation).   Past Week at Unknown time  . MELATONIN PO Take 1 tablet by mouth at bedtime as needed (sleep).   Past Week at Unknown time  . mupirocin ointment (BACTROBAN) 2 % Apply 1 application topically 2 (two) times a week.   Past Week at Unknown time  . NASCOBAL 500 MCG/0.1ML SOLN Place 1 spray into both nostrils every Friday.   Past Week at Unknown time  . pravastatin (PRAVACHOL) 10 MG tablet Take 10 mg by mouth at bedtime.   Past Week at Unknown time      Eudelia Bunch, Pharm.D 04/07/2020 11:01 AM

## 2020-04-08 ENCOUNTER — Other Ambulatory Visit (HOSPITAL_COMMUNITY): Payer: Self-pay | Admitting: General Surgery

## 2020-04-08 ENCOUNTER — Encounter (HOSPITAL_COMMUNITY): Payer: Self-pay | Admitting: Surgery

## 2020-04-08 LAB — CBC WITH DIFFERENTIAL/PLATELET
Abs Immature Granulocytes: 0.04 10*3/uL (ref 0.00–0.07)
Basophils Absolute: 0 10*3/uL (ref 0.0–0.1)
Basophils Relative: 0 %
Eosinophils Absolute: 0 10*3/uL (ref 0.0–0.5)
Eosinophils Relative: 0 %
HCT: 36 % (ref 36.0–46.0)
Hemoglobin: 11.8 g/dL — ABNORMAL LOW (ref 12.0–15.0)
Immature Granulocytes: 0 %
Lymphocytes Relative: 12 %
Lymphs Abs: 1.3 10*3/uL (ref 0.7–4.0)
MCH: 24.4 pg — ABNORMAL LOW (ref 26.0–34.0)
MCHC: 32.8 g/dL (ref 30.0–36.0)
MCV: 74.4 fL — ABNORMAL LOW (ref 80.0–100.0)
Monocytes Absolute: 0.7 10*3/uL (ref 0.1–1.0)
Monocytes Relative: 7 %
Neutro Abs: 8.5 10*3/uL — ABNORMAL HIGH (ref 1.7–7.7)
Neutrophils Relative %: 81 %
Platelets: 219 10*3/uL (ref 150–400)
RBC: 4.84 MIL/uL (ref 3.87–5.11)
RDW: 16.6 % — ABNORMAL HIGH (ref 11.5–15.5)
WBC: 10.5 10*3/uL (ref 4.0–10.5)
nRBC: 0 % (ref 0.0–0.2)

## 2020-04-08 MED ORDER — GABAPENTIN 100 MG PO CAPS: 200.0000 mg | ORAL_CAPSULE | Freq: Two times a day (BID) | ORAL | 0 refills | Status: DC

## 2020-04-08 MED ORDER — ACETAMINOPHEN 500 MG PO TABS: 1000.0000 mg | ORAL_TABLET | Freq: Three times a day (TID) | ORAL | 0 refills | Status: AC

## 2020-04-08 MED ORDER — ONDANSETRON 4 MG PO TBDP: 4.0000 mg | ORAL_TABLET | Freq: Four times a day (QID) | ORAL | 0 refills | Status: DC | PRN

## 2020-04-08 MED ORDER — PANTOPRAZOLE SODIUM 40 MG PO TBEC: 40.0000 mg | DELAYED_RELEASE_TABLET | Freq: Every day | ORAL | 0 refills | Status: DC

## 2020-04-08 MED ORDER — OXYCODONE HCL 5 MG PO TABS: 5.0000 mg | ORAL_TABLET | Freq: Four times a day (QID) | ORAL | 0 refills | Status: DC | PRN

## 2020-04-08 NOTE — Discharge Summary (Signed)
Physician Discharge Summary  Pamela Simon IRW:[id number] DOB: 1970/12/23 DOA: 04/07/2020  PCP: Bartholome Bill, MD  Admit date: 04/07/2020 Discharge date: 04/08/2020  Recommendations for Outpatient Follow-up:    Follow-up Information    Surgery, [place]. Go on 04/30/2020.   Specialty: General Surgery Why: at 9am with Dr. Hassell Done.  Please arrive 15 minutes prior to your appointment time.  Thank you.  Contact information: [address] [place] [address] [phone]        Carlena Hurl, PA-C. Go on 05/27/2020.   Specialty: General Surgery Why: at 2:15pm for Dr. Hassell Done.  Please arrive 15 minutes prior to your appointment time.  Thank you. Contact information: [place] [address] [phone]              Discharge Diagnoses:  Active Problems:   Obesity   Status post laparoscopic sleeve gastrectomy Hypertension Hyperlipidemia Prediabetes  Surgical Procedure: Laparoscopic Sleeve Gastrectomy by Dr Hassell Done, upper endoscopy  Discharge Condition: Good Disposition: Home  Diet recommendation: Postoperative sleeve gastrectomy diet (liquids only)  Filed Weights   04/07/20 1134  Weight: 134.3 kg     Hospital Course:  The patient was admitted for a planned laparoscopic sleeve gastrectomy by Dr Hassell Done. Please see operative note. Preoperatively the patient was given 5000 units of subcutaneous heparin for DVT prophylaxis. Postoperative prophylactic chemical VTE dosing was started on the evening of postoperative day 0. ERAS protocol was used. On the evening of postoperative day 0, the patient was started on water and ice chips. On postoperative day 1 the patient had no fever or tachycardia and was tolerating water in their diet was gradually advanced throughout the day. The patient was ambulating without difficulty. She had typical pain around her extraction site.  Their vital signs are stable without fever or tachycardia.  Their hemoglobin had remained stable.  The patient had received discharge instructions and counseling. They were deemed stable for discharge and had met discharge criteria  BP (!) 158/88 (BP Location: Right Arm)   Pulse 74   Temp 99 F (37.2 C) (Oral)   Resp 18   Ht 5' 4"  (1.626 m)   Wt 134.3 kg   SpO2 99%   BMI 50.82 kg/m   Gen: alert, NAD, non-toxic appearing Pupils: equal, no scleral icterus Pulm: Lungs clear to auscultation, symmetric chest rise CV: regular rate and rhythm Abd: soft, min tender, nondistended.  No cellulitis. No incisional hernia Ext: no edema, no calf tenderness Skin: no rash, no jaundice   Discharge Instructions  Discharge Instructions    Ambulate hourly while awake   Complete by: As directed    Call MD for:  difficulty breathing, headache or visual disturbances   Complete by: As directed    Call MD for:  persistant dizziness or light-headedness   Complete by: As directed    Call MD for:  persistant nausea and vomiting   Complete by: As directed    Call MD for:  redness, tenderness, or signs of infection (pain, swelling, redness, odor or green/yellow discharge around incision site)   Complete by: As directed    Call MD for:  severe uncontrolled pain   Complete by: As directed    Call MD for:  temperature >101 F   Complete by: As directed    Diet bariatric full liquid   Complete by: As directed    Discharge instructions   Complete by: As directed    See bariatric discharge instructions  Incentive spirometry   Complete by: As directed    Perform hourly while awake     Allergies as of 04/08/2020   No Known Allergies     Medication List    TAKE these medications   acetaminophen 500 MG tablet Commonly known as: TYLENOL Take 2 tablets (1,000 mg total) by mouth every 8 (eight) hours for 5 days.   amLODipine 10 MG tablet Commonly known as: NORVASC Take 10 mg by mouth daily. Notes to patient: Monitor Blood Pressure Daily and keep a log for  primary care physician.  You may need to make changes to your medications with rapid weight loss.     escitalopram 20 MG tablet Commonly known as: LEXAPRO Take 20 mg by mouth daily.   fluticasone 50 MCG/ACT nasal spray Commonly known as: FLONASE Place 2 sprays into both nostrils at bedtime as needed for allergies.   gabapentin 100 MG capsule Commonly known as: NEURONTIN Take 2 capsules (200 mg total) by mouth every 12 (twelve) hours.   hydrochlorothiazide 25 MG tablet Commonly known as: HYDRODIURIL Take 25 mg by mouth daily. Notes to patient: Monitor Blood Pressure Daily and keep a log for primary care physician.  Monitor for symptoms of dehydration.  You may need to make changes to your medications with rapid weight loss.     ketoconazole 2 % cream Commonly known as: NIZORAL Apply 1 application topically daily as needed (skin irritation).   MELATONIN PO Take 1 tablet by mouth at bedtime as needed (sleep).   mupirocin ointment 2 % Commonly known as: BACTROBAN Apply 1 application topically 2 (two) times a week.   Nascobal 500 MCG/0.1ML Soln Generic drug: Cyanocobalamin Place 1 spray into both nostrils every Friday.   ondansetron 4 MG disintegrating tablet Commonly known as: ZOFRAN-ODT Take 1 tablet (4 mg total) by mouth every 6 (six) hours as needed for nausea or vomiting.   oxyCODONE 5 MG immediate release tablet Commonly known as: Oxy IR/ROXICODONE Take 1 tablet (5 mg total) by mouth every 6 (six) hours as needed for severe pain.   pantoprazole 40 MG tablet Commonly known as: PROTONIX Take 1 tablet (40 mg total) by mouth daily.   pravastatin 10 MG tablet Commonly known as: PRAVACHOL Take 10 mg by mouth at bedtime.   VITAMIN D3 PO Take 1 tablet by mouth at bedtime.       Follow-up Information    Surgery, [place]. Go on 04/30/2020.   Specialty: General Surgery Why: at 9am with Dr. Hassell Done.  Please arrive 15 minutes prior to your appointment time.  Thank  you.  Contact information: [address] [place] [address] [phone]        Carlena Hurl, PA-C. Go on 05/27/2020.   Specialty: General Surgery Why: at 2:15pm for Dr. Hassell Done.  Please arrive 15 minutes prior to your appointment time.  Thank you. Contact information: [address] [place] [address] [phone]                The results of significant diagnostics from this hospitalization (including imaging, microbiology, ancillary and laboratory) are listed below for reference.    Significant Diagnostic Studies: No results found.  Labs: Basic Metabolic Panel: Recent Labs  Lab 04/07/20 1100  CREATININE 0.75   Liver Function Tests: No results for input(s): AST, ALT, ALKPHOS, BILITOT, PROT, ALBUMIN in the last 168 hours.  CBC: Recent Labs  Lab 04/07/20 1100 04/07/20 1317 04/08/20 0512  WBC 13.1*  --  10.5  NEUTROABS  --   --  8.5*  HGB 12.5 12.3 11.8*  HCT 37.6 37.8 36.0  MCV 73.4*  --  74.4*  PLT 251  --  219    CBG: No results for input(s): GLUCAP in the last 168 hours.  Active Problems:   Obesity   Status post laparoscopic sleeve gastrectomy   Time coordinating discharge: 15 min  Signed:  Gayland Curry, MD [facility], [place] [phone] 04/08/2020, 10:53 AM

## 2020-04-08 NOTE — Progress Notes (Signed)
Nutrition Education Note  Received consult for diet education for patient s/p bariatric surgery.  Discussed 2 week post op diet with pt. Emphasized that liquids must be non carbonated, non caffeinated, and sugar free. Fluid goals discussed. Pt to follow up with outpatient bariatric RD for further diet progression after 2 weeks. Multivitamins and minerals also reviewed. Teach back method used, pt expressed understanding, expect good compliance.  If nutrition issues arise, please consult RD.  Clayton Bibles, MS, RD, LDN Inpatient Clinical Dietitian Contact information available via Amion

## 2020-04-08 NOTE — Progress Notes (Signed)
Patient alert and oriented, pain is controlled. Patient is tolerating fluids, advanced to protein shake today, patient is tolerating well.  Reviewed Gastric sleeve discharge instructions with patient and patient is able to articulate understanding.  Provided information on BELT program, Support Group and [facility] outpatient pharmacy. All questions answered, will continue to monitor.  Total 24hr fluid recall: 921m. Per dehydration protocol, will call pt to f/u within one week post op.

## 2020-04-08 NOTE — Discharge Instructions (Signed)
GASTRIC BYPASS / [place] Instructions  These instructions are to help you care for yourself when you go home.  Call: If you have any problems. . Call [phone] and ask for the surgeon on call . If you have an emergency related to your surgery please use the ER at [facility].  . Tell the ER staff that you are a new post-op gastric bypass or gastric sleeve patient   Signs and symptoms to report: . Severe vomiting or nausea o If you cannot handle clear liquids for longer than 1 day, call your surgeon  . Abdominal pain which does not get better after taking your pain medication . Fever greater than 100.4 F and chills . Heart rate over 100 beats a minute . Trouble breathing . Chest pain .  Redness, swelling, drainage, or foul odor at incision (surgical) sites .  If your incisions open or pull apart . Swelling or pain in calf (lower leg) . Diarrhea (Loose bowel movements that happen often), frequent watery, uncontrolled bowel movements . Constipation, (no bowel movements for 3 days) if this happens:  o Take Milk of Magnesia, 2 tablespoons by mouth, 3 times a day for 2 days if needed o Stop taking Milk of Magnesia once you have had a bowel movement o Call your doctor if constipation continues Or o Take Miralax  (instead of Milk of Magnesia) following the label instructions o Stop taking Miralax once you have had a bowel movement o Call your doctor if constipation continues . Anything you think is "abnormal for you"   Normal side effects after surgery: . Unable to sleep at night or unable to concentrate . Irritability . Being tearful (crying) or depressed These are common complaints, possibly related to your anesthesia, stress of surgery and change in lifestyle, that usually go away a few weeks after surgery.  If these feelings continue, call your medical doctor.  Wound Care: You may have surgical glue, steri-strips, or staples over your incisions after surgery . Surgical  glue:  Looks like a clear film over your incisions and will wear off a little at a time . Steri-strips : Adhesive strips of tape over your incisions. You may notice a yellowish color on the skin under the steri-strips. This is used to make the   steri-strips stick better. Do not pull the steri-strips off - let them fall off . Staples: Jodell Cipro may be removed before you leave the hospital o If you go home with staples, call [facility] at for an appointment with your surgeon's nurse to have staples removed 10 days after surgery, [phone] . Showering: You may shower two (2) days after your surgery unless your surgeon tells you differently o Wash gently around incisions with warm soapy water, rinse well, and gently pat dry  o If you have a drain (tube from your incision), you may need someone to hold this while you shower  o No tub baths until staples are removed and incisions are healed     Medications: Marland Kitchen Medications should be liquid or crushed if larger than the size of a dime . Extended release pills (medication that releases a little bit at a time through the day) should not be crushed . Depending on the size and number of medications you take, you may need to space (take a few throughout the day)/change the time you take your medications so that you do not over-fill your pouch (smaller stomach) . Make sure you follow-up with  your primary care physician to make medication changes needed during rapid weight loss and life-style changes . If you have diabetes, follow up with the doctor that orders your diabetes medication(s) within one week after surgery and check your blood sugar regularly. . Do not drive while taking narcotics (pain medications) . DO NOT take NSAID'S (Examples of NSAID's include ibuprofen, naproxen)  Diet:                    First 2 Weeks  You will see the nutritionist about two (2) weeks after your surgery. The nutritionist will increase the types of foods you can  eat if you are handling liquids well: Marland Kitchen If you have severe vomiting or nausea and cannot handle clear liquids lasting longer than 1 day, call your surgeon  Protein Shake . Drink at least 2 ounces of shake 5-6 times per day . Each serving of protein shakes (usually 8 - 12 ounces) should have a minimum of:  o 15 grams of protein  o And no more than 5 grams of carbohydrate  . Goal for protein each day: o Men = 80 grams per day o Women = 60 grams per day . Protein powder may be added to fluids such as non-fat milk or Lactaid milk or Soy milk (limit to 35 grams added protein powder per serving)  Hydration . Slowly increase the amount of water and other clear liquids as tolerated (See Acceptable Fluids) . Slowly increase the amount of protein shake as tolerated  .  Sip fluids slowly and throughout the day . May use sugar substitutes in small amounts (no more than 6 - 8 packets per day; i.e. Splenda)  Fluid Goal . The first goal is to drink at least 8 ounces of protein shake/drink per day (or as directed by the nutritionist);  See handout from pre-op Bariatric Education Class for examples of protein shake/drink.   o Slowly increase the amount of protein shake you drink as tolerated o You may find it easier to slowly sip shakes throughout the day o It is important to get your proteins in first . Your fluid goal is to drink 64 - 100 ounces of fluid daily o It may take a few weeks to build up to this . 32 oz (or more) should be clear liquids  And  . 32 oz (or more) should be full liquids (see below for examples) . Liquids should not contain sugar, caffeine, or carbonation  Clear Liquids: . Water or Sugar-free flavored water (i.e. Fruit H2O, Propel) . Decaffeinated coffee or tea (sugar-free) . [employer], [employer], Minute [facility] . Sugar-free Jell-O . Bouillon or broth . Sugar-free Popsicle:   *Less than 20 calories each; Limit 1 per day  Full Liquids: Protein Shakes/Drinks + 2  choices per day of other full liquids . Full liquids must be: o No More Than 12 grams of Carbs per serving  o No More Than 3 grams of Fat per serving . Strained low-fat cream soup . Non-Fat milk . Fat-free Lactaid Milk . Sugar-free yogurt (Dannon Lite & Fit, Greek yogurt)      Vitamins and Minerals . Start 1 day after surgery unless otherwise directed by your surgeon . Bariatric Specific Complete Multivitamins . Chewable Calcium Citrate with Vitamin D-3 (Example: 3 Chewable Calcium Plus 600 with Vitamin D-3) o Take 500 mg three (3) times a day for a total of 1500 mg each day o Do not take all 3 doses  of calcium at one time as it may cause constipation, and you can only absorb 500 mg  at a time  o Do not mix multivitamins containing iron with calcium supplements; take 2 hours apart  . Menstruating women and those at risk for anemia (a blood disease that causes weakness) may need extra iron o Talk with your doctor to see if you need more iron . If you need extra iron: Total daily Iron recommendation (including Vitamins) is 50 to 100 mg Iron/day . Do not stop taking or change any vitamins or minerals until you talk to your nutritionist or surgeon . Your nutritionist and/or surgeon must approve all vitamin and mineral supplements   Activity and Exercise: It is important to continue walking at home.  Limit your physical activity as instructed by your doctor.  During this time, use these guidelines: . Do not lift anything greater than ten (10) pounds for at least two (2) weeks . Do not go back to work or drive until Engineer, production says you can . You may have sex when you feel comfortable  o It is VERY important for female patients to use a reliable birth control method; fertility often increases after surgery  o Do not get pregnant for at least 18 months . Start exercising as soon as your doctor tells you that you can o Make sure your doctor approves any physical activity . Start with a  simple walking program . Walk 5-15 minutes each day, 7 days per week.  . Slowly increase until you are walking 30-45 minutes per day Consider joining our [place] program. [phone] or email [email or web address]   Special Instructions Things to remember:  . Use your CPAP when sleeping if this applies to you, do not stop the use of CPAP unless directed by physician after a sleep study . [facility] has a free Bariatric Surgery Support Group that meets monthly, the 3rd Thursday, 6 pm.  Please review discharge information for date and location of this meeting. . It is very important to keep all follow up appointments with your surgeon, nutritionist, primary care physician, and behavioral health practitioner o After the first year, please follow up with your bariatric surgeon and nutritionist at least once a year in order to maintain best weight loss results   [place]: [place]: [phone] Bariatric Nurse Coordinator: [phone]

## 2020-04-08 NOTE — Progress Notes (Signed)
Discharge instructions given to patient and all questions were answered.

## 2020-04-09 LAB — SURGICAL PATHOLOGY

## 2020-04-15 ENCOUNTER — Telehealth (HOSPITAL_COMMUNITY): Payer: Self-pay | Admitting: *Deleted

## 2020-04-15 NOTE — Telephone Encounter (Signed)
1.  Tell me about your pain and pain management? Pt denies any pain.   2.  Let's talk about fluid intake.  How much total fluid are you taking in? Pt states that she is getting in at least 64oz of fluid including protein shakes, Propel bottled water, and strained chicken broth.   3.  How much protein have you taken in the last 2 days? Pt states she is meeting her goal of 60g of protein each day with the protein shakes.   4.  Have you had nausea?  Tell me about when have experienced nausea and what you did to help? Pt denies nausea.   5.  Has the frequency or color changed with your urine? Pt states that she is urinating "fine" with no changes in frequency or urgency.     6.  Tell me what your incisions look like? "Incisions look fine". Pt denies a fever, chills.  Pt states incisions are not swollen, open, or draining.  Pt encouraged to call [facility] if incisions change.   7.  Have you been passing gas? BM? Pt states that she is having BMs. Last BM 04/13/20.     8.  If a problem or question were to arise who would you call?  Do you know contact numbers for [facility], [facility], and [facility]? Pt denies dehydration symptoms.  Pt can describe s/sx of dehydration.  Pt knows to call [facility] for surgical, [facility] for nutrition, and [facility] for non-urgent questions or concerns.   9.  How has the walking going? Pt states she is walking around and able to be active without difficulty.   10.  How are your vitamins and calcium going?  How are you taking them? Pt states that she is taking her supplements and vitamins without difficulty.  Reminded patient that first 30 days post-operatively are important for successful recovery.  Practice good hand hygiene, wearing a mask when appropriate (since optional in most places), and minimizing exposure to people who live outside of the home, especially if they are exhibiting any respiratory or GI symptoms.

## 2020-04-22 ENCOUNTER — Other Ambulatory Visit: Payer: Self-pay

## 2020-04-22 ENCOUNTER — Encounter: Payer: 59 | Attending: Surgery | Admitting: Skilled Nursing Facility1

## 2020-04-22 DIAGNOSIS — E669 Obesity, unspecified: Secondary | ICD-10-CM | POA: Diagnosis not present

## 2020-04-23 NOTE — Progress Notes (Signed)
2 Week Post-Operative Nutrition Class   Patient was seen on 03/21/18 for Post-Operative Nutrition education at [facility].    Surgery date: 04/07/2020 Surgery type: sleeve Start weight at [facility]: 298.6 Weight today:280.8   Body Composition Scale 04/22/2020  Current Body Weight 280.8  Total Body Fat % 48.3  Visceral Fat 20  Fat-Free Mass % 51.6   Total Body Water % 40.3  Muscle-Mass lbs 31.6  BMI 48.2  Body Fat Displacement          Torso  lbs 84.2         Left Leg  lbs 16.8         Right Leg  lbs 16.8         Left Arm  lbs 8.4         Right Arm   lbs 8.4      The following the learning objectives were met by the patient during this course:  Identifies Phase 3 (Soft, High Proteins) Dietary Goals and will begin from 2 weeks post-operatively to 2 months post-operatively  Identifies appropriate sources of fluids and proteins   Identifies appropriate fat sources and healthy verses unhealthy fat types    States protein recommendations and appropriate sources post-operatively  Identifies the need for appropriate texture modifications, mastication, and bite sizes when consuming solids  Identifies appropriate multivitamin and calcium sources post-operatively  Describes the need for physical activity post-operatively and will follow MD recommendations  States when to call healthcare provider regarding medication questions or post-operative complications   Handouts given during class include:  Phase 3A: Soft, High Protein Diet Handout  Phase 3 High Protein Meals  Healthy Fats   Follow-Up Plan: Patient will follow-up at [facility] in 6 weeks for 2 month post-op nutrition visit for diet advancement per MD.

## 2020-04-29 ENCOUNTER — Telehealth: Payer: Self-pay | Admitting: Skilled Nursing Facility1

## 2020-04-29 NOTE — Telephone Encounter (Signed)
RD called pt to verify fluid intake once starting soft, solid proteins 2 week post-bariatric surgery.   Daily Fluid intake: 64 oz Daily Protein intake: 60 g  Concerns/issues:   No concerns stated

## 2020-06-02 ENCOUNTER — Encounter: Payer: 59 | Attending: Surgery | Admitting: Skilled Nursing Facility1

## 2020-06-02 ENCOUNTER — Other Ambulatory Visit: Payer: Self-pay

## 2020-06-02 DIAGNOSIS — E669 Obesity, unspecified: Secondary | ICD-10-CM | POA: Insufficient documentation

## 2020-06-02 NOTE — Progress Notes (Signed)
Bariatric Nutrition Follow-Up Visit Medical Nutrition Therapy   NUTRITION ASSESSMENT    Anthropometrics  Surgery date: 04/07/2020 Surgery type: sleeve Start weight at [facility]: 298.6 Weight today: 268 pounds   Body Composition Scale 04/22/2020  Current Body Weight 280.8  Total Body Fat % 48.3  Visceral Fat 20  Fat-Free Mass % 51.6   Total Body Water % 40.3  Muscle-Mass lbs 31.6  BMI 48.2  Body Fat Displacement          Torso  lbs 84.2         Left Leg  lbs 16.8         Right Leg  lbs 16.8         Left Arm  lbs 8.4         Right Arm   lbs 8.4   Clinical  Medical hx: prediabetes Medications: off HTZ Labs:    Lifestyle & Dietary Hx  Pt state she had nausea a couple times but no issues overall. Pt states her depression has been acting up due to mothers day stating she is coming out of it now. Pt states she is realizing she does have anxiety realizing she was not admitting that previously.  Pt states she is worrying about her weight loss and realizing she is letting it get to her sometimes but she talks to herself and helps her to realize she has been walking daily and been doing overall well.  Pt states she really is wanting pasta: Dietitian advised pt to avoid pasta think through her sources. Pt state she is worried about her sweet tooth so looking for cookies recipes for people that have had bariatric surgery: Dietitian advised pt to think through her choices and the behaviors behind the choice she makes.    Estimated daily fluid intake: 55 oz Estimated daily protein intake: 60 g Supplements: chewable multi and calcium Current average weekly physical activity: walking at work   24-Hr Dietary Recall First Meal: protein shake (5 days a Week) Snack:  Second Meal: chicken noodle soup without noodles or deli meat + cheese Snack: sometimes nuts Third Meal: chicken or fish + beans  Snack: Beverages: coffee + almond milk creamer + stevia, water  Post-Op Goals/ Signs/  Symptoms Using straws: no Drinking while eating: no Chewing/swallowing difficulties: no Changes in vision: no Changes to mood/headaches: no Hair loss/changes to skin/nails: no Difficulty focusing/concentrating: no Sweating: no Dizziness/lightheadedness: no Palpitations: no  Carbonated/caffeinated beverages: no N/V/D/C/Gas: a movement every 4th day Abdominal pain: no Dumping syndrome: no    NUTRITION DIAGNOSIS  Overweight/obesity ([place]-3.3) related to past poor dietary habits and physical inactivity as evidenced by completed bariatric surgery and following dietary guidelines for continued weight loss and healthy nutrition status.     NUTRITION INTERVENTION Nutrition counseling (C-1) and education (E-2) to facilitate bariatric surgery goals, including: . Diet advancement to the next phase (phase 4) now including non starchy vegetables  . The importance of consuming adequate calories as well as certain nutrients daily due to the body's need for essential vitamins, minerals, and fats . The importance of daily physical activity and to reach a goal of at least 150 minutes of moderate to vigorous physical activity weekly (or as directed by their physician) due to benefits such as increased musculature and improved lab values . The importance of intuitive eating specifically learning hunger-satiety cues and understanding the importance of learning a new body: The importance of mindful eating to avoid grazing behaviors   Goals: -Continue to aim  for a minimum of 64 fluid ounces 7 days a week with at least 30 ounces being plain water -Eat non-starchy vegetables 2 times a day 7 days a week -Start out with soft cooked vegetables today and tomorrow; if tolerated begin to eat raw vegetables or cooked including salads -Eat your 3 ounces of protein first then start in on your non-starchy vegetables; once you understand how much of your meal leads to satisfaction and not full while still eating 3 ounces  of protein and non-starchy vegetables you can eat them in any order  -Continue to aim for 30 minutes of activity at least 5 times a week -Do NOT cook with/add to your food: alfredo sauce, cheese sauce, barbeque sauce, ketchup, fat back, butter, bacon grease, grease, Crisco, OR SUGAR -Avoid using a protein shake for breakfast more than 2 times a week -Create more nutrient dense meals for lunch -Try the spaghetti squash recipe   Handouts Provided Include   Phase 4  Learning Style & Readiness for Change Teaching method utilized: Visual & Auditory  Demonstrated degree of understanding via: Teach Back  Readiness Level: action Barriers to learning/adherence to lifestyle change: none identified   RD's Notes for Next Visit . Assess adherence to pt chosen goals   MONITORING & EVALUATION Dietary intake, weekly physical activity, body weight  Next Steps Patient is to follow-up in 3-4 months

## 2020-07-10 LAB — COLOGUARD: COLOGUARD: NEGATIVE

## 2020-08-21 ENCOUNTER — Other Ambulatory Visit: Payer: Self-pay

## 2020-08-21 ENCOUNTER — Ambulatory Visit: Payer: 59 | Attending: Internal Medicine | Admitting: Internal Medicine

## 2020-08-21 ENCOUNTER — Encounter: Payer: Self-pay | Admitting: Internal Medicine

## 2020-08-21 VITALS — BP 114/82 | HR 65 | Resp 16 | Ht 64.0 in | Wt 261.0 lb

## 2020-08-21 DIAGNOSIS — M170 Bilateral primary osteoarthritis of knee: Secondary | ICD-10-CM | POA: Diagnosis not present

## 2020-08-21 DIAGNOSIS — I10 Essential (primary) hypertension: Secondary | ICD-10-CM | POA: Diagnosis not present

## 2020-08-21 DIAGNOSIS — Z6841 Body Mass Index (BMI) 40.0 and over, adult: Secondary | ICD-10-CM

## 2020-08-21 DIAGNOSIS — E782 Mixed hyperlipidemia: Secondary | ICD-10-CM

## 2020-08-21 DIAGNOSIS — Z9884 Bariatric surgery status: Secondary | ICD-10-CM

## 2020-08-21 DIAGNOSIS — Z7689 Persons encountering health services in other specified circumstances: Secondary | ICD-10-CM

## 2020-08-21 DIAGNOSIS — Z8659 Personal history of other mental and behavioral disorders: Secondary | ICD-10-CM

## 2020-08-21 DIAGNOSIS — Z1211 Encounter for screening for malignant neoplasm of colon: Secondary | ICD-10-CM

## 2020-08-21 NOTE — Patient Instructions (Signed)

## 2020-08-21 NOTE — Progress Notes (Signed)
Patient ID: Pamela Simon, female    DOB: November 16, 1970  MRN: [id number]  CC: New Patient (Initial Visit) and Hypertension   Subjective: Pamela Simon is a 50 y.o. female who presents for new patient visit. Her concerns today include: Patient with history of HTN, HL, prediabetes, obesity status post bariatric surgery (03/2020 -sleeve), MDD, GERD, OA BL knee,  Previous PCP was Dr. Precious Haws at [facility].  She was last seen by her in May of this year and had her physical.  She felt care was not as good as she would like so she decided to change.  I reviewed some of her records through [facility] including the last note from her previous PCP, Dr. Luciana Axe, whom she saw in May of this year.  OA knees: wearing brace RT knee. Seen by [facility] last for pain and swelling.  Told she has arthritis and bone spurs.  Placed on Prednisone for 1 wk.  Getting better.    HTN HYPERTENSION Currently taking: see medication list.  She is on amlodipine 10 mg daily. Med Adherence: [x]  Yes    []  No Medication side effects: []  Yes    [x]  No Adherence with salt restriction: [x]  Yes    []  No SOB? []  Yes    [x]  No Chest Pain?: []  Yes    [x]  No Leg swelling?: []  Yes    [x]  No Headaches?: []  Yes    [x]  No Dizziness? []  Yes    [x]  No Comments:   HL: Taking and tolerating Pravachol  Obesity/PreDM: last A1C in May 2022 was 5.6. She had bariatric surgery, sleeve procedure earlier this year.  Started at 297 lbs at time of wgh reduction surgery.  Loss 36 lbs so far. Her goal is to get to 170 lbs. she feels her weight has plateaued a little bit.  She anxiously awaits to get back to exercising once her right knee is better.  She plans to start again next week.  She was going to the gym 3 times a week and walking 2 days a week. Has f/u with surgeon in 09/2020.  Taking a Bariatric MV, Vit D 3, Biotin and TUMS.  Continues to try to stay on track with eating habits but admits that sometimes she get  off track.  She has history of depression and is on Lexapro.  Reports that she is doing well on the medications and depression is well controlled.  HM: Last Tdap shot was 12/27/2006.  Declines having this today.  She had a hysterectomy in 2017 due to fibroids.  Due for colon cancer screening.  She is agreeable to referral to GI for colonoscopy.  Last milligram 05/09/2020.   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     Cholecalciferol (VITAMIN D3 PO) Take 1 tablet by mouth at bedtime. (Patient not taking: Reported on 08/21/2020)     escitalopram (LEXAPRO) 20 MG tablet Take 20 mg by mouth daily.     fluticasone (FLONASE) 50 MCG/ACT nasal spray Place 2 sprays into both nostrils at bedtime as needed for allergies.     ketoconazole (NIZORAL) 2 % cream Apply 1 application topically daily as needed (skin irritation).     MELATONIN PO Take  1 tablet by mouth at bedtime as needed (sleep).     mupirocin ointment (BACTROBAN) 2 % Apply 1 application topically 2 (two) times a week.     NASCOBAL 500 MCG/0.1ML SOLN Place 1 spray into both nostrils every Friday.     pravastatin (PRAVACHOL) 10 MG tablet Take 10 mg by mouth at bedtime.     No current facility-administered medications on file prior to visit.    No Known Allergies  Social History   Socioeconomic History   Marital status: Single    Spouse name: Not on file   Number of children: Not on file   Years of education: Not on file   Highest education level: Not on file  Occupational History   Not on file  Tobacco Use   Smoking status: Never   Smokeless tobacco: Never  Vaping Use   Vaping Use: Never used  Substance and Sexual Activity   Alcohol use: Not Currently   Drug use: Not Currently   Sexual  activity: Not on file  Other Topics Concern   Not on file  Social History Narrative   Not on file   Social Determinants of Health   Financial Resource Strain: Not on file  Food Insecurity: Not on file  Transportation Needs: Not on file  Physical Activity: Not on file  Stress: Not on file  Social Connections: Not on file  Intimate Partner Violence: Not on file    Family History  Problem Relation Age of Onset   Stroke Mother    Hypertension Mother    Diabetes Mother    Depression Mother    Hypertension Father    Stroke Father    Diabetes Father     Past Surgical History:  Procedure Laterality Date   ABDOMINAL HYSTERECTOMY     CARPAL TUNNEL RELEASE     LAPAROSCOPIC GASTRIC SLEEVE RESECTION N/A 04/07/2020   Procedure: LAPAROSCOPIC GASTRIC SLEEVE RESECTION;  Surgeon: Johnathan Hausen, MD;  Location: [facility];  Service: General;  Laterality: N/A;   partial hsyterectomy     TUBAL LIGATION  2014   UPPER GI ENDOSCOPY N/A 04/07/2020   Procedure: UPPER GI ENDOSCOPY;  Surgeon: Johnathan Hausen, MD;  Location: [facility];  Service: General;  Laterality: N/A;    ROS: Review of Systems Negative except as stated above  PHYSICAL EXAM: BP 114/82   Pulse 65   Resp 16   Ht 5' 4"  (1.626 m)   Wt 261 lb (118.4 kg)   SpO2 98%   BMI 44.80 kg/m   Physical Exam  General appearance - alert, well appearing, obese middle-aged African-American female and in no distress Mental status - normal mood, behavior, speech, dress, motor activity, and thought processes Eyes - pupils equal and reactive, extraocular eye movements intact Nose - normal and patent, no erythema, discharge or polyps Mouth - mucous membranes moist, pharynx normal without lesions Neck - supple, no significant adenopathy.  No thyroid enlargement. Chest - clear to auscultation, no wheezes, rales or rhonchi, symmetric air entry Heart - normal rate, regular rhythm, normal S1, S2, no murmurs, rubs, clicks or gallops Extremities -  peripheral pulses normal, no pedal edema, no clubbing or cyanosis  Depression screen [facility] 2/9 08/21/2020 01/07/2020  Decreased Interest 0 0  Down, Depressed, Hopeless 0 0  PHQ - 2 Score 0 0    CMP Latest Ref Rng & Units 04/07/2020 03/31/2020 02/14/2019  Glucose 70 - 99 mg/dL - 112(H) 112(H)  BUN 6 - 20 mg/dL - 13 8  Creatinine 0.44 - 1.00 mg/dL 0.75 0.80 0.89  Sodium 135 - 145 mmol/L - 142 135  Potassium 3.5 - 5.1 mmol/L - 4.0 4.1  Chloride 98 - 111 mmol/L - 105 97(L)  CO2 22 - 32 mmol/L - 24 27  Calcium 8.9 - 10.3 mg/dL - 9.4 9.1  Total Protein 6.5 - 8.1 g/dL - 7.8 -  Total Bilirubin 0.3 - 1.2 mg/dL - 0.6 -  Alkaline Phos 38 - 126 U/L - 70 -  AST 15 - 41 U/L - 33 -  ALT 0 - 44 U/L - 48(H) -   Lipid Panel  No results found for: CHOL, TRIG, HDL, CHOLHDL, VLDL, LDLCALC, LDLDIRECT  CBC    Component Value Date/Time   WBC 10.5 04/08/2020 0512   RBC 4.84 04/08/2020 0512   HGB 11.8 (L) 04/08/2020 0512   HCT 36.0 04/08/2020 0512   PLT 219 04/08/2020 0512   MCV 74.4 (L) 04/08/2020 0512   MCH 24.4 (L) 04/08/2020 0512   MCHC 32.8 04/08/2020 0512   RDW 16.6 (H) 04/08/2020 0512   LYMPHSABS 1.3 04/08/2020 0512   MONOABS 0.7 04/08/2020 0512   EOSABS 0.0 04/08/2020 0512   BASOSABS 0.0 04/08/2020 0512   Labs done 06/20/2020 through [facility] were reviewed by me: CBC-Hb 13.4/HCT 41 LDL 89 BUN/creatinine 7/0.89.  GFR 80. HIV test negative. A1c 5.6. ASSESSMENT AND PLAN: 1. Encounter to establish care Records reviewed through care everywhere including PCPs notes, labs, studies and health maintenance items..  2. Essential hypertension Close to goal.  Continue amlodipine and low-salt diet.  3. Obesity, Class III, BMI 40-49.9 (morbid obesity) ([place]) 4. History of bariatric surgery -Commended her on weight loss so far.  Stressed the importance of consistency with regular exercise and trying to eat healthy with smaller portions to achieve her weight goal.  Advised to continue taking the  vitamins that she is currently taking to prevent deficiencies that can occur with weight reduction surgery. -She was no longer prediabetic based on last A1c.  She was not aware of this.  5. Mixed hyperlipidemia Continue Pravachol.  LDL was at goal.  6. Primary osteoarthritis of both knees Discussed the importance of continued weight loss to help take mechanical strain of the knees.  7. History of depression Well-controlled on Lexapro.  8. Screening for colon cancer Discussed colon cancer screening methods.  She is agreeable to doing colonoscopy. - Ambulatory referral to Gastroenterology   Patient was given the opportunity to ask questions.  Patient verbalized understanding of the plan and was able to repeat key elements of the plan.   I spent  Orders Placed This Encounter  Procedures   Ambulatory referral to Gastroenterology     Requested Prescriptions    No prescriptions requested or ordered in this encounter    Return in about 4 months (around 12/22/2020).  Karle Plumber, MD, FACP

## 2020-09-02 ENCOUNTER — Other Ambulatory Visit: Payer: Self-pay

## 2020-09-02 ENCOUNTER — Encounter: Payer: 59 | Attending: Surgery | Admitting: Skilled Nursing Facility1

## 2020-09-02 DIAGNOSIS — E669 Obesity, unspecified: Secondary | ICD-10-CM | POA: Insufficient documentation

## 2020-09-02 NOTE — Progress Notes (Signed)
Bariatric Nutrition Follow-Up Visit Medical Nutrition Therapy   NUTRITION ASSESSMENT    Anthropometrics  Surgery date: 04/07/2020 Surgery type: sleeve Start weight at [facility]: 298.6 Weight today: 257.7 pounds   Body Composition Scale 04/22/2020 09/02/2020  Current Body Weight 280.8 257.7  Total Body Fat % 48.3 46.4  Visceral Fat 20 17  Fat-Free Mass % 51.6 53.5   Total Body Water % 40.3 41.2  Muscle-Mass lbs 31.6 31.3  BMI 48.2 44.2  Body Fat Displacement           Torso  lbs 84.2 74.2         Left Leg  lbs 16.8 14.8         Right Leg  lbs 16.8 14.8         Left Arm  lbs 8.4 7.4         Right Arm   lbs 8.4 7.4   Clinical  Medical hx: prediabetes Medications: off HTZ Labs:    Lifestyle & Dietary Hx  Pt states this couple months has been challenging, pt states she avoided [employer] due to a COVID+ people at [employer] so she will go to her employee gym but then her knee gave out so needed a round of prednisone.   Pt state with not being active for 2 weeks she realized working out helps her to sleep better and overall feel better. Pt states she has been doing good with letting the stress over her adult daughter go.  Pt states she has been pushing water so she is more full (not drinking with meals).  Pt states she has bought some cookies  Pt states she feels she needs to eat more vegetables but does not like canned or frozen.  Pt states she realized when she was down with her knee she was Eating out too much.  Estimated daily fluid intake: 55 oz Estimated daily protein intake: 60 g Supplements: chewable multi and calcium Current average weekly physical activity: walking at work and the gym  24-Hr Dietary Recall First Meal: protein shake or 2 boiled eggs Snack: mixed nuts or almonds Second Meal: chicken Cesar salad or high fiber wrap + lunch meat + cheese or chicken salad Snack: smart popcorn  Third Meal: chicken or fish + beans + broccoli or spinach Snack: plum Beverages: coffee +  almond milk creamer + stevia, water  Post-Op Goals/ Signs/ Symptoms Using straws: no Drinking while eating: no Chewing/swallowing difficulties: no Changes in vision: no Changes to mood/headaches: no Hair loss/changes to skin/nails: no Difficulty focusing/concentrating: no Sweating: no Dizziness/lightheadedness: no Palpitations: no  Carbonated/caffeinated beverages: no N/V/D/C/Gas: a movement every 4th day Abdominal pain: no Dumping syndrome: no    NUTRITION DIAGNOSIS  Overweight/obesity ([place]-3.3) related to past poor dietary habits and physical inactivity as evidenced by completed bariatric surgery and following dietary guidelines for continued weight loss and healthy nutrition status.     NUTRITION INTERVENTION Nutrition counseling (C-1) and education (E-2) to facilitate bariatric surgery goals, including: The importance of consuming adequate calories as well as certain nutrients daily due to the body's need for essential vitamins, minerals, and fats The importance of daily physical activity and to reach a goal of at least 150 minutes of moderate to vigorous physical activity weekly (or as directed by their physician) due to benefits such as increased musculature and improved lab values The importance of intuitive eating specifically learning hunger-satiety cues and understanding the importance of learning a new body: The importance of mindful eating to avoid grazing  behaviors  Creating balanced meals   Goals:  Try some new vegetable recipes sent to your email Remember the success you have already achieved! You are doing great!!  Handouts Provided Include  Phase 6  Learning Style & Readiness for Change Teaching method utilized: Visual & Auditory  Demonstrated degree of understanding via: Teach Back  Readiness Level: action Barriers to learning/adherence to lifestyle change: none identified   RD's Notes for Next Visit Assess adherence to pt chosen goals   MONITORING &  EVALUATION Dietary intake, weekly physical activity, body weight  Next Steps Patient is to follow-up in 3-4 months

## 2020-10-17 ENCOUNTER — Encounter: Payer: Self-pay | Admitting: Gastroenterology

## 2020-11-04 ENCOUNTER — Ambulatory Visit (AMBULATORY_SURGERY_CENTER): Payer: 59 | Admitting: *Deleted

## 2020-11-04 ENCOUNTER — Other Ambulatory Visit: Payer: Self-pay

## 2020-11-04 ENCOUNTER — Encounter: Payer: Self-pay | Admitting: Gastroenterology

## 2020-11-04 VITALS — Ht 64.0 in | Wt 255.0 lb

## 2020-11-04 DIAGNOSIS — Z1211 Encounter for screening for malignant neoplasm of colon: Secondary | ICD-10-CM

## 2020-11-04 MED ORDER — CLENPIQ 10-3.5-12 MG-GM -GM/160ML PO SOLN: 1.0000 | ORAL | 0 refills | Status: DC

## 2020-11-04 NOTE — Progress Notes (Signed)
No egg or soy allergy known to patient  No issues known to pt with past sedation with any surgeries or procedures Patient denies ever being told they had issues or difficulty with intubation  No FH of Malignant Hyperthermia Pt is not on diet pills Pt is not on  home 02  Pt is not on blood thinners  Pt denies issues with constipation  No A fib or A flutter  Pt is not  vaccinated  for Covid   Clenpiq Coupon given to pt in PV today , Code to Pharmacy and  NO PA's for preps discussed with pt In PV today  Discussed with pt there will be an out-of-pocket cost for prep and that varies from $0 to 70 +  dollars - pt verbalized understanding   Due to the COVID-19 pandemic we are asking patients to follow certain guidelines in PV and the [facility]   Pt aware of COVID protocols and [facility] guidelines   Pt verified name, DOB, address and insurance during PV today.  Pt mailed instruction packet of Emmi video, copy of consent form to read and not return, and instructions. Clenpiq coupon mailed in packet. PV completed over the phone.  Pt encouraged to call with questions or issues.  My Chart instructions to pt as well

## 2020-11-13 ENCOUNTER — Telehealth: Payer: Self-pay | Admitting: Gastroenterology

## 2020-11-13 DIAGNOSIS — Z1211 Encounter for screening for malignant neoplasm of colon: Secondary | ICD-10-CM

## 2020-11-13 MED ORDER — SUTAB 1479-225-188 MG PO TABS: 24.0000 | ORAL_TABLET | ORAL | 0 refills | Status: DC

## 2020-11-13 NOTE — Telephone Encounter (Signed)
Sent in [place] to Pharmacy with coupon   Sent in new My chart instructions - pt instructed to read new instructions amd call with questions

## 2020-11-19 ENCOUNTER — Encounter: Payer: Self-pay | Admitting: Gastroenterology

## 2020-11-19 ENCOUNTER — Other Ambulatory Visit: Payer: Self-pay

## 2020-11-19 ENCOUNTER — Ambulatory Visit (AMBULATORY_SURGERY_CENTER): Payer: 59 | Admitting: Gastroenterology

## 2020-11-19 VITALS — BP 110/72 | HR 62 | Temp 97.8°F | Resp 12 | Ht 64.0 in | Wt 255.0 lb

## 2020-11-19 DIAGNOSIS — Z538 Procedure and treatment not carried out for other reasons: Secondary | ICD-10-CM | POA: Diagnosis not present

## 2020-11-19 DIAGNOSIS — Z1211 Encounter for screening for malignant neoplasm of colon: Secondary | ICD-10-CM

## 2020-11-19 MED ORDER — SODIUM CHLORIDE 0.9 % IV SOLN: 500.0000 mL | Freq: Once | INTRAVENOUS | Status: DC

## 2020-11-19 NOTE — Progress Notes (Signed)
[facility] History and Physical   Primary Care Physician:  Ladell Pier, MD   Reason for Procedure:  Colorectal cancer screening  Plan:    Screening colonoscopy with possible interventions as needed     HPI: Pamela Simon is a very pleasant 50 y.o. female here for screening colonoscopy. Denies any nausea, vomiting, abdominal pain, melena or bright red blood per rectum  The risks and benefits as well as alternatives of endoscopic procedure(s) have been discussed and reviewed. All questions answered. The patient agrees to proceed.    Past Medical History:  Diagnosis Date   Allergy    seasonal   Arthritis    knees   Depression    Hyperlipidemia    Hypertension    Pre-diabetes     Past Surgical History:  Procedure Laterality Date   ABDOMINAL HYSTERECTOMY     CARPAL TUNNEL RELEASE Left    COLONOSCOPY     20 + years ago   [place] N/A 04/07/2020   Procedure: LAPAROSCOPIC GASTRIC SLEEVE RESECTION;  Surgeon: Johnathan Hausen, MD;  Location: [facility];  Service: General;  Laterality: N/A;   partial hsyterectomy     TUBAL LIGATION  2014   UPPER GASTROINTESTINAL ENDOSCOPY     UPPER GI ENDOSCOPY N/A 04/07/2020   Procedure: UPPER GI ENDOSCOPY;  Surgeon: Johnathan Hausen, MD;  Location: [facility];  Service: General;  Laterality: N/A;    Prior to Admission medications   Medication Sig Start Date End Date Taking? Authorizing Provider  amLODipine (NORVASC) 10 MG tablet Take 10 mg by mouth daily.    [provider]  Cholecalciferol (VITAMIN D3 PO) Take 1 tablet by mouth at bedtime.    [provider]  clindamycin (CLEOCIN T) 1 % lotion Apply topically. 09/10/20   [provider]  doxycycline (VIBRAMYCIN) 100 MG capsule Take 100 mg by mouth 2 (two) times daily. Patient not taking: Reported on 11/04/2020 09/10/20   [provider]  escitalopram (LEXAPRO) 20 MG tablet Take 20 mg by mouth daily. 03/01/20    [provider]  fluticasone (FLONASE) 50 MCG/ACT nasal spray Place 2 sprays into both nostrils at bedtime as needed for allergies. 02/22/20   [provider]  MELATONIN PO Take 1 tablet by mouth at bedtime as needed (sleep).    [provider]  Multiple Vitamin (MULTI-VITAMIN) tablet Take 1 tablet by mouth daily.    [provider]  mupirocin ointment (BACTROBAN) 2 % Apply 1 application topically as needed. 02/28/20   [provider]  pravastatin (PRAVACHOL) 10 MG tablet Take 10 mg by mouth at bedtime. 02/01/20   [provider]  Sod Picosulfate-Mag Ox-Cit Acd (CLENPIQ) 10-3.5-12 MG-GM -GM/160ML SOLN Take 1 kit by mouth as directed. NO PA's for prep- pt has coupon 11/04/20   Mauri Pole, MD  Sodium Sulfate-Mag Sulfate-KCl (SUTAB) [phone] MG TABS Take 24 tablets by mouth as directed. MANUFACTURER CODES!! BIN: [id number] PCN: CN GROUP: [id number] MEMBER ID: [id number];KPT AS SECONDARY INSURANCE ;NO PRIOR AUTHORIZATION 11/13/20   Mauri Pole, MD    Current Outpatient Medications  Medication Sig Dispense Refill   amLODipine (NORVASC) 10 MG tablet Take 10 mg by mouth daily.     Cholecalciferol (VITAMIN D3 PO) Take 1 tablet by mouth at bedtime.     clindamycin (CLEOCIN T) 1 % lotion Apply topically.     doxycycline (VIBRAMYCIN) 100 MG capsule Take 100 mg by mouth 2 (two) times daily. (Patient not taking: Reported on  11/04/2020)     escitalopram (LEXAPRO) 20 MG tablet Take 20 mg by mouth daily.     fluticasone (FLONASE) 50 MCG/ACT nasal spray Place 2 sprays into both nostrils at bedtime as needed for allergies.     MELATONIN PO Take 1 tablet by mouth at bedtime as needed (sleep).     Multiple Vitamin (MULTI-VITAMIN) tablet Take 1 tablet by mouth daily.     mupirocin ointment (BACTROBAN) 2 % Apply 1 application topically as needed.     pravastatin (PRAVACHOL) 10 MG tablet Take 10 mg by mouth at bedtime.     Sod Picosulfate-Mag Ox-Cit  Acd (CLENPIQ) 10-3.5-12 MG-GM -GM/160ML SOLN Take 1 kit by mouth as directed. NO PA's for prep- pt has coupon 320 mL 0   Sodium Sulfate-Mag Sulfate-KCl (SUTAB) [phone] MG TABS Take 24 tablets by mouth as directed. MANUFACTURER CODES!! BIN: [id number] PCN: CN GROUP: [id number] MEMBER ID: [id number];ASN AS SECONDARY INSURANCE ;NO PRIOR AUTHORIZATION 24 tablet 0   No current facility-administered medications for this visit.    Allergies as of 11/19/2020   (No Known Allergies)    Family History  Problem Relation Age of Onset   Stroke Mother    Hypertension Mother    Diabetes Mother    Depression Mother    Colon polyps Father    Hypertension Father    Stroke Father    Diabetes Father    Colon cancer Neg Hx    Esophageal cancer Neg Hx    Rectal cancer Neg Hx    Stomach cancer Neg Hx     Social History   Socioeconomic History   Marital status: Single    Spouse name: Not on file   Number of children: Not on file   Years of education: Not on file   Highest education level: Not on file  Occupational History   Not on file  Tobacco Use   Smoking status: Never   Smokeless tobacco: Never  Vaping Use   Vaping Use: Never used  Substance and Sexual Activity   Alcohol use: Not Currently   Drug use: Not Currently   Sexual activity: Not on file  Other Topics Concern   Not on file  Social History Narrative   Not on file   Social Determinants of Health   Financial Resource Strain: Not on file  Food Insecurity: Not on file  Transportation Needs: Not on file  Physical Activity: Not on file  Stress: Not on file  Social Connections: Not on file  Intimate Partner Violence: Not on file    Review of Systems:  All other review of systems negative except as mentioned in the HPI.  Physical Exam: Vital signs in last 24 hours: _0 @   General:   Alert, NAD Lungs:  Clear .   Heart:  Regular rate and rhythm Abdomen:  Soft, nontender and nondistended. Neuro/Psych:  Alert and  cooperative. Normal mood and affect. A and O x 3  Reviewed labs, radiology imaging, old records and pertinent past GI work up  Patient is appropriate for planned procedure(s) and anesthesia in an ambulatory setting   K. Denzil Magnuson , MD [phone]

## 2020-11-19 NOTE — Progress Notes (Signed)
VS taken by [place]

## 2020-11-19 NOTE — Progress Notes (Signed)
Pt's states no medical or surgical changes since previsit or office visit. 

## 2020-11-19 NOTE — Patient Instructions (Signed)
YOU HAD AN ENDOSCOPIC PROCEDURE TODAY AT [facility]:   Refer to the procedure report that was given to you for any specific questions about what was found during the examination.  If the procedure report does not answer your questions, please call your gastroenterologist to clarify.  If you requested that your care partner not be given the details of your procedure findings, then the procedure report has been included in a sealed envelope for you to review at your convenience later.  YOU SHOULD EXPECT: Some feelings of bloating in the abdomen. Passage of more gas than usual.  Walking can help get rid of the air that was put into your GI tract during the procedure and reduce the bloating. If you had a lower endoscopy (such as a colonoscopy or flexible sigmoidoscopy) you may notice spotting of blood in your stool or on the toilet paper. If you underwent a bowel prep for your procedure, you may not have a normal bowel movement for a few days.  Please Note:  You might notice some irritation and congestion in your nose or some drainage.  This is from the oxygen used during your procedure.  There is no need for concern and it should clear up in a day or so.  SYMPTOMS TO REPORT IMMEDIATELY:  Following lower endoscopy (colonoscopy or flexible sigmoidoscopy):  Excessive amounts of blood in the stool  Significant tenderness or worsening of abdominal pains  Swelling of the abdomen that is new, acute  Fever of 100F or higher   For urgent or emergent issues, a gastroenterologist can be reached at any hour by calling [phone]. Do not use MyChart messaging for urgent concerns.    DIET:  We do recommend a small meal at first, but then you may proceed to your regular diet.  Drink plenty of fluids but you should avoid alcoholic beverages for 24 hours.  ACTIVITY:  You should plan to take it easy for the rest of today and you should NOT DRIVE or use heavy machinery until tomorrow (because  of the sedation medicines used during the test).    FOLLOW UP: Our staff will call the number listed on your records 48-72 hours following your procedure to check on you and address any questions or concerns that you may have regarding the information given to you following your procedure. If we do not reach you, we will leave a message.  We will attempt to reach you two times.  During this call, we will ask if you have developed any symptoms of COVID 19. If you develop any symptoms (ie: fever, flu-like symptoms, shortness of breath, cough etc.) before then, please call [phone].  If you test positive for Covid 19 in the 2 weeks post procedure, please call and report this information to [place].    If any biopsies were taken you will be contacted by phone or by letter within the next 1-3 weeks.  Please call us at [phone] if you have not heard about the biopsies in 3 weeks.    SIGNATURES/CONFIDENTIALITY: You and/or your care partner have signed paperwork which will be entered into your electronic medical record.  These signatures attest to the fact that that the information above on your After Visit Summary has been reviewed and is understood.  Full responsibility of the confidentiality of this discharge information lies with you and/or your care-partner.    Resume medications. Repeat procedure 12/22/20.

## 2020-11-19 NOTE — Progress Notes (Signed)
A and O x3. Report to RN. Tolerated MAC anesthesia well. 

## 2020-11-19 NOTE — Op Note (Signed)
[facility] Patient Name: Pamela Simon Procedure Date: 11/19/2020 1:10 PM MRN: [id number] Endoscopist: Mauri Pole , MD Age: 50 Referring MD:  Date of Birth: [birth year] Gender: Female Account #: [id number] Procedure:                Colonoscopy Indications:              Screening for colorectal malignant neoplasm Medicines:                Monitored Anesthesia Care Procedure:                Pre-Anesthesia Assessment:                           - Prior to the procedure, a History and Physical                            was performed, and patient medications and                            allergies were reviewed. The patient's tolerance of                            previous anesthesia was also reviewed. The risks                            and benefits of the procedure and the sedation                            options and risks were discussed with the patient.                            All questions were answered, and informed consent                            was obtained. Prior Anticoagulants: The patient has                            taken no previous anticoagulant or antiplatelet                            agents. ASA Grade Assessment: II - A patient with                            mild systemic disease. After reviewing the risks                            and benefits, the patient was deemed in                            satisfactory condition to undergo the procedure.                           After obtaining informed consent, the colonoscope  was passed under direct vision. Throughout the                            procedure, the patient's blood pressure, pulse, and                            oxygen saturations were monitored continuously. The                            Olympus PCF-H190DL ([id number]) Colonoscope was                            introduced through the anus with the intention of                             advancing to the cecum. The scope was advanced to                            the descending colon before the procedure was                            aborted. Medications were given. The colonoscopy                            was performed without difficulty. The patient                            tolerated the procedure well. The quality of the                            bowel preparation was poor. The rectum was                            photographed. Scope In: 1:27:59 PM Scope Out: 1:36:31 PM Total Procedure Duration: 0 hours 8 minutes 32 seconds  Findings:                 The perianal and digital rectal examinations were                            normal.                           A large amount of stool was found in the rectum, in                            the sigmoid colon and in the descending colon,                            interfering with visualization. Lavage of the area                            was performed, resulting in incomplete clearance  with continued poor visualization. Complications:            No immediate complications. Estimated Blood Loss:     Estimated blood loss: none. Impression:               - Preparation of the colon was poor.                           - Stool in the rectum, in the sigmoid colon and in                            the descending colon.                           - No specimens collected. Recommendation:           - Patient has a contact number available for                            emergencies. The signs and symptoms of potential                            delayed complications were discussed with the                            patient. Return to normal activities tomorrow.                            Written discharge instructions were provided to the                            patient.                           - Resume previous diet.                           - Continue present medications.                            - Repeat colonoscopy at the next available                            appointment because the bowel preparation was                            suboptimal.                           - For future colonoscopy the patient will require                            an extended preparation. If there are any                            questions, please contact the gastroenterologist. Mauri Pole, MD 11/19/2020 1:40:58 PM This report has been signed electronically.

## 2020-11-21 ENCOUNTER — Telehealth: Payer: Self-pay

## 2020-11-21 NOTE — Telephone Encounter (Signed)
  Follow up Call-  Call back number 11/19/2020  Post procedure Call Back phone  # [phone]  Permission to leave phone message Yes     Patient questions:  Do you have a fever, pain , or abdominal swelling? No. Pain Score  0 *  Have you tolerated food without any problems? Yes.    Have you been able to return to your normal activities? Yes.    Do you have any questions about your discharge instructions: Diet   No. Medications  No. Follow up visit  No.  Do you have questions or concerns about your Care? No.  Actions: * If pain score is 4 or above: No action needed, pain <4.

## 2020-12-03 ENCOUNTER — Ambulatory Visit: Payer: 59 | Admitting: Skilled Nursing Facility1

## 2020-12-22 ENCOUNTER — Encounter: Payer: 59 | Admitting: Gastroenterology

## 2020-12-22 ENCOUNTER — Telehealth: Payer: Self-pay | Admitting: Gastroenterology

## 2020-12-22 ENCOUNTER — Ambulatory Visit: Payer: 59 | Admitting: Internal Medicine

## 2020-12-22 NOTE — Telephone Encounter (Signed)
Ok

## 2021-01-02 ENCOUNTER — Other Ambulatory Visit: Payer: Self-pay | Admitting: Internal Medicine

## 2021-01-02 MED ORDER — AMLODIPINE BESYLATE 10 MG PO TABS: 10.0000 mg | ORAL_TABLET | Freq: Every day | ORAL | 1 refills | Status: DC

## 2021-01-02 MED ORDER — PRAVASTATIN SODIUM 10 MG PO TABS: 10.0000 mg | ORAL_TABLET | Freq: Every day | ORAL | 1 refills | Status: DC

## 2021-01-02 MED ORDER — ESCITALOPRAM OXALATE 20 MG PO TABS: 20.0000 mg | ORAL_TABLET | Freq: Every day | ORAL | 1 refills | Status: DC

## 2021-01-02 NOTE — Telephone Encounter (Signed)
Requested medication (s) are due for refill today: Yes  Requested medication (s) are on the active medication list: Yes  Last refill:    Future visit scheduled: Yes  Notes to clinic:  Historical provider on medications.    Requested Prescriptions  Pending Prescriptions Disp Refills   amLODipine (NORVASC) 10 MG tablet      Sig: Take 1 tablet (10 mg total) by mouth daily.     Cardiovascular:  Calcium Channel Blockers Passed - 01/02/2021 12:28 PM      Passed - Last BP in normal range    BP Readings from Last 1 Encounters:  11/19/20 110/72          Passed - Valid encounter within last 6 months    Recent Outpatient Visits           4 months ago Encounter to establish care   [place], MD       Future Appointments             In 2 months Ladell Pier, MD [place]             escitalopram (LEXAPRO) 20 MG tablet      Sig: Take 1 tablet (20 mg total) by mouth daily.     Psychiatry:  Antidepressants - SSRI Passed - 01/02/2021 12:28 PM      Passed - Completed PHQ-2 or PHQ-9 in the last 360 days      Passed - Valid encounter within last 6 months    Recent Outpatient Visits           4 months ago Encounter to establish care   [place], MD       Future Appointments             In 2 months Ladell Pier, MD [place]             pravastatin (PRAVACHOL) 10 MG tablet      Sig: Take 1 tablet (10 mg total) by mouth at bedtime.     Cardiovascular:  Antilipid - Statins Failed - 01/02/2021 12:28 PM      Failed - Total Cholesterol in normal range and within 360 days    No results found for: CHOL, POCCHOL, CHOLTOT        Failed - LDL in normal range and within 360 days    No results found for: LDLCALC, LDLC, HIRISKLDL, POCLDL, LDLDIRECT, REALLDLC, TOTLDLC        Failed - HDL in normal  range and within 360 days    No results found for: HDL, POCHDL        Failed - Triglycerides in normal range and within 360 days    No results found for: TRIG, POCTRIG        Passed - Patient is not pregnant      Passed - Valid encounter within last 12 months    Recent Outpatient Visits           4 months ago Encounter to establish care   [place], MD       Future Appointments             In 2 months Wynetta Emery Dalbert Batman, MD [place]

## 2021-01-02 NOTE — Telephone Encounter (Signed)
Medication Refill - Medication: pravastatin (PRAVACHOL) 10 MG tablet  escitalopram (LEXAPRO) 20 MG tablet   She also mentioned her BP medication but couldn't remember the name / pt is asking for these to be refilled with a 90 day supply due to her getting new employment and she will be without insurance for a little while / please advise asap    Has the patient contacted their pharmacy? No. (Agent: If no, request that the patient contact the pharmacy for the refill. If patient does not wish to contact the pharmacy document the reason why and proceed with request.) (Agent: If yes, when and what did the pharmacy advise?)  Preferred Pharmacy (with phone number or street name):  [facility], [place] Phone:  [phone]  Fax:  [phone]     Has the patient been seen for an appointment in the last year OR does the patient have an upcoming appointment? Yes.    Agent: Please be advised that RX refills may take up to 3 business days. We ask that you follow-up with your pharmacy.

## 2021-01-30 ENCOUNTER — Encounter (HOSPITAL_COMMUNITY): Payer: Self-pay | Admitting: *Deleted

## 2021-02-24 ENCOUNTER — Encounter (HOSPITAL_COMMUNITY): Payer: Self-pay

## 2021-02-24 ENCOUNTER — Emergency Department (HOSPITAL_COMMUNITY)
Admission: EM | Admit: 2021-02-24 | Discharge: 2021-02-25 | Disposition: A | Discharge status: Home / Self Care | Payer: Managed Care, Other (non HMO) | Attending: Emergency Medicine | Admitting: Emergency Medicine

## 2021-02-24 DIAGNOSIS — R079 Chest pain, unspecified: Secondary | ICD-10-CM

## 2021-02-24 DIAGNOSIS — I10 Essential (primary) hypertension: Secondary | ICD-10-CM | POA: Diagnosis not present

## 2021-02-24 DIAGNOSIS — R0789 Other chest pain: Secondary | ICD-10-CM | POA: Diagnosis not present

## 2021-02-24 DIAGNOSIS — N9489 Other specified conditions associated with female genital organs and menstrual cycle: Secondary | ICD-10-CM | POA: Diagnosis not present

## 2021-02-24 LAB — CBC
HCT: 40.5 % (ref 36.0–46.0)
Hemoglobin: 13.3 g/dL (ref 12.0–15.0)
MCH: 25 pg — ABNORMAL LOW (ref 26.0–34.0)
MCHC: 32.8 g/dL (ref 30.0–36.0)
MCV: 76.3 fL — ABNORMAL LOW (ref 80.0–100.0)
Platelets: 238 10*3/uL (ref 150–400)
RBC: 5.31 MIL/uL — ABNORMAL HIGH (ref 3.87–5.11)
RDW: 16.7 % — ABNORMAL HIGH (ref 11.5–15.5)
WBC: 4.4 10*3/uL (ref 4.0–10.5)
nRBC: 0 % (ref 0.0–0.2)

## 2021-02-24 NOTE — ED Provider Triage Note (Signed)
Emergency Medicine Provider Triage Evaluation Note  Pamela Simon , a 51 y.o. female  was evaluated in triage.  Pt complains of left-sided chest pain characterized as pressure that started this morning.  Patient does have a history of hypertension but no significant cardiac disease otherwise.  Reports associated mild shortness of breath.  No nausea, vomiting, diarrhea.  Review of Systems  Positive:  Negative: See above   Physical Exam  BP 133/88 (BP Location: Right Arm)    Pulse 62    Temp 98.3 F (36.8 C) (Oral)    Resp 16    Ht 5' 4"  (1.626 m)    Wt 115.2 kg    SpO2 100%    BMI 43.60 kg/m  Gen:   Awake, no distress   Resp:  Normal effort  MSK:   Moves extremities without difficulty  Other:    Medical Decision Making  Medically screening exam initiated at 11:51 PM.  Appropriate orders placed.  Pamela Simon was informed that the remainder of the evaluation will be completed by another provider, this initial triage assessment does not replace that evaluation, and the importance of remaining in the ED until their evaluation is complete.     Pamela Simon [employer], [place] 02/24/21 2352

## 2021-02-24 NOTE — ED Triage Notes (Signed)
Patient arrives from home with complaint of left sided chest pain that does not radiate. Pt endorses chest pain upon exhalation and headache that began today.

## 2021-02-25 ENCOUNTER — Emergency Department (HOSPITAL_COMMUNITY): Payer: Managed Care, Other (non HMO)

## 2021-02-25 LAB — BASIC METABOLIC PANEL
Anion gap: 7 (ref 5–15)
BUN: 8 mg/dL (ref 6–20)
CO2: 26 mmol/L (ref 22–32)
Calcium: 8.6 mg/dL — ABNORMAL LOW (ref 8.9–10.3)
Chloride: 106 mmol/L (ref 98–111)
Creatinine, Ser: 0.81 mg/dL (ref 0.44–1.00)
GFR, Estimated: >60 mL/min (ref >60)
Glucose, Bld: 92 mg/dL (ref 70–99)
Potassium: 3.6 mmol/L (ref 3.5–5.1)
Sodium: 139 mmol/L (ref 135–145)

## 2021-02-25 LAB — HCG, QUANTITATIVE, PREGNANCY: hCG, Beta Chain, Quant, S: 2 m[IU]/mL (ref <5)

## 2021-02-25 LAB — TROPONIN I (HIGH SENSITIVITY): Troponin I (High Sensitivity): <2 ng/L (ref <18)

## 2021-02-25 NOTE — ED Provider Notes (Signed)
[facility] Emergency Department Provider Note MRN:  [id number]  Arrival date & time: 02/25/21     Chief Complaint   Chest Pain   History of Present Illness   ROYALE Simon is a 51 y.o. year-old female with a history of hypertension presenting to the ED with chief complaint of chest pain.  Left-sided chest pain described as a soreness, present all day today while at work.  Worse with certain motions and palpation.  No dizziness or sweatiness, no shortness of breath, no nausea vomiting.  No leg pain or swelling.  Review of Systems  A thorough review of systems was obtained and all systems are negative except as noted in the HPI and PMH.   Patient's Health History    Past Medical History:  Diagnosis Date   Allergy    seasonal   Arthritis    knees   Depression    Hyperlipidemia    Hypertension    Pre-diabetes     Past Surgical History:  Procedure Laterality Date   ABDOMINAL HYSTERECTOMY     CARPAL TUNNEL RELEASE Left    COLONOSCOPY     20 + years ago   LAPAROSCOPIC GASTRIC SLEEVE RESECTION N/A 04/07/2020   Procedure: LAPAROSCOPIC GASTRIC SLEEVE RESECTION;  Surgeon: Johnathan Hausen, MD;  Location: [facility];  Service: General;  Laterality: N/A;   partial hsyterectomy     TUBAL LIGATION  2014   UPPER GASTROINTESTINAL ENDOSCOPY     UPPER GI ENDOSCOPY N/A 04/07/2020   Procedure: UPPER GI ENDOSCOPY;  Surgeon: Johnathan Hausen, MD;  Location: [facility];  Service: General;  Laterality: N/A;    Family History  Problem Relation Age of Onset   Stroke Mother    Hypertension Mother    Diabetes Mother    Depression Mother    Colon polyps Father    Hypertension Father    Stroke Father    Diabetes Father    Colon cancer Neg Hx    Esophageal cancer Neg Hx    Rectal cancer Neg Hx    Stomach cancer Neg Hx     Social History   Socioeconomic History   Marital status: Single    Spouse name: Not on file   Number of children: Not on file   Years of  education: Not on file   Highest education level: Not on file  Occupational History   Not on file  Tobacco Use   Smoking status: Never   Smokeless tobacco: Never  Vaping Use   Vaping Use: Never used  Substance and Sexual Activity   Alcohol use: Not Currently   Drug use: Not Currently   Sexual activity: Not on file  Other Topics Concern   Not on file  Social History Narrative   Not on file   Social Determinants of Health   Financial Resource Strain: Not on file  Food Insecurity: Not on file  Transportation Needs: Not on file  Physical Activity: Not on file  Stress: Not on file  Social Connections: Not on file  Intimate Partner Violence: Not on file     Physical Exam   Vitals:   02/24/21 2333  BP: 133/88  Pulse: 62  Resp: 16  Temp: 98.3 F (36.8 C)  SpO2: 100%    CONSTITUTIONAL: Well-appearing, NAD NEURO/PSYCH:  Alert and oriented x 3, no focal deficits EYES:  eyes equal and reactive ENT/NECK:  no LAD, no JVD CARDIO: Regular rate, well-perfused, normal S1 and S2; focal tenderness palpation to the  left anterior chest near ribs 3 and 4 PULM:  CTAB no wheezing or rhonchi GI/GU:  non-distended, non-tender MSK/SPINE:  No gross deformities, no edema SKIN:  no rash, atraumatic   *Additional and/or pertinent findings included in MDM below  Diagnostic and Interventional Summary    EKG Interpretation  Date/Time:  Tuesday February 24 2021 23:34:11 EST Ventricular Rate:  65 PR Interval:  147 QRS Duration: 91 QT Interval:  405 QTC Calculation: 422 R Axis:   14 Text Interpretation: Sinus rhythm Abnormal R-wave progression, early transition Confirmed by Gerlene Fee [phone]) on 02/25/2021 1:19:48 AM       Labs Reviewed  BASIC METABOLIC PANEL - Abnormal; Notable for the following components:      Result Value   Calcium 8.6 (*)    All other components within normal limits  CBC - Abnormal; Notable for the following components:   RBC 5.31 (*)    MCV 76.3 (*)     MCH 25.0 (*)    RDW 16.7 (*)    All other components within normal limits  HCG, QUANTITATIVE, PREGNANCY  TROPONIN I (HIGH SENSITIVITY)  TROPONIN I (HIGH SENSITIVITY)    DG Chest 2 View  Final Result      Medications - No data to display   Procedures  /  Critical Care Procedures  ED Course and Medical Decision Making  Initial Impression and Ddx Favoring MSK or chest wall pain, ACS also considered.  PE felt to be very unlikely given the lack of tachycardia, no hypoxia, no shortness of breath, no leg pain or swelling, little to no risk factors.  Past medical/surgical history that increases complexity of ED encounter: Hypertension as a cardiovascular risk factor, as well as obesity  Interpretation of Diagnostics I personally reviewed the EKG and my interpretation is as follows: Sinus rhythm with no ischemic features    Labs reassuring with negative troponin  Patient Reassessment and Ultimate Disposition/Management Discharge home with reassurance, PCP follow-up.  Patient management required discussion with the following services or consulting groups:  None  Complexity of Problems Addressed Acute complicated illness or Injury  Additional Data Reviewed and Analyzed Further history obtained from: Past medical history and medications listed in the EMR  Factors Impacting ED Encounter Risk Consideration of hospitalization  Barth Kirks. [place]onia Small, [place] [email or web address]  Final Clinical Impressions(s) / ED Diagnoses     ICD-10-CM   1. Chest pain, unspecified type  R07.9       ED Discharge Orders     None        Discharge Instructions Discussed with and Provided to Patient:    Discharge Instructions      You were evaluated in the Emergency Department and after careful evaluation, we did not find any emergent condition requiring admission or further testing in the hospital.  Your exam/testing today was overall  reassuring.  No evidence of heart attack.  Recommend following up with your primary care doctor to discuss your symptoms.  Please return to the Emergency Department if you experience any worsening of your condition.  Thank you for allowing [place] to be a part of your care.       Maudie Flakes, MD 02/25/21 [phone]

## 2021-02-25 NOTE — ED Notes (Signed)
Save blue tube in main lab

## 2021-02-25 NOTE — Discharge Instructions (Addendum)
You were evaluated in the Emergency Department and after careful evaluation, we did not find any emergent condition requiring admission or further testing in the hospital.  Your exam/testing today was overall reassuring.  No evidence of heart attack.  Recommend following up with your primary care doctor to discuss your symptoms.  Please return to the Emergency Department if you experience any worsening of your condition.  Thank you for allowing [place] to be a part of your care.

## 2021-03-06 ENCOUNTER — Encounter: Payer: Self-pay | Admitting: Internal Medicine

## 2021-03-06 ENCOUNTER — Ambulatory Visit: Payer: Managed Care, Other (non HMO) | Attending: Internal Medicine | Admitting: Internal Medicine

## 2021-03-06 ENCOUNTER — Other Ambulatory Visit: Payer: Self-pay

## 2021-03-06 VITALS — BP 121/86 | HR 60 | Resp 16 | Ht 64.0 in | Wt 254.4 lb

## 2021-03-06 DIAGNOSIS — E66813 Obesity, class 3: Secondary | ICD-10-CM

## 2021-03-06 DIAGNOSIS — R079 Chest pain, unspecified: Secondary | ICD-10-CM

## 2021-03-06 DIAGNOSIS — I10 Essential (primary) hypertension: Secondary | ICD-10-CM | POA: Diagnosis not present

## 2021-03-06 DIAGNOSIS — F32A Depression, unspecified: Secondary | ICD-10-CM

## 2021-03-06 DIAGNOSIS — E782 Mixed hyperlipidemia: Secondary | ICD-10-CM

## 2021-03-06 MED ORDER — PRAVASTATIN SODIUM 10 MG PO TABS: 10.0000 mg | ORAL_TABLET | Freq: Every day | ORAL | 1 refills | Status: DC

## 2021-03-06 MED ORDER — FLUTICASONE PROPIONATE 50 MCG/ACT NA SUSP: 2.0000 | Freq: Every evening | NASAL | 4 refills | Status: AC | PRN

## 2021-03-06 MED ORDER — ESCITALOPRAM OXALATE 20 MG PO TABS: 20.0000 mg | ORAL_TABLET | Freq: Every day | ORAL | 1 refills | Status: DC

## 2021-03-06 MED ORDER — AMLODIPINE BESYLATE 10 MG PO TABS: 10.0000 mg | ORAL_TABLET | Freq: Every day | ORAL | 1 refills | Status: DC

## 2021-03-06 NOTE — Progress Notes (Signed)
Patient ID: Pamela Simon, female    DOB: 20-Jul-1970  MRN: [id number]  CC: Hypertension   Subjective: Pamela Simon is a 51 y.o. female who presents for chronic ds Her concerns today include:  Patient with history of HTN, HL, prediabetes, obesity status post bariatric surgery (03/2020 -sleeve), MDD, GERD, OA BL knee,  Obesity: She feels her weight has plateaued some.  She has loss about 50 pounds since weight reduction surgery in March of last year.  Weight down an additional 6 pounds since last visit with me in July 2022. Since last visit with me, she has started a job that requires her commute to [place] 3 days a week.  Just started trying to exercise again.  She has not done as well with her eating habits as she would like.  Needs to do better with meal planning so that she is not eating out as much. She continues to take her bariatric multivitamin, vitamin D3, biotin and Tums.    MDD control on Lexapro.  Requests refill.  HTN: Not checking blood pressure.  She has not taken amlodipine as yet for today.  She tries to limit salt in the foods.  HL: Compliant with pravastatin  Seen in the emergency room 02/24/2021 with palpitations and chest pain.  Work-up was negative.  EKG showed no acute findings.  She has not had any further episodes since then. Patient Active Problem List   Diagnosis Date Noted   Mixed hyperlipidemia 08/21/2020   Obesity, Class III, BMI 40-49.9 (morbid obesity) ([place]) 08/21/2020   Essential hypertension 08/21/2020   Primary osteoarthritis of both knees 08/21/2020   History of depression 08/21/2020   Obesity 04/07/2020   Status post laparoscopic sleeve gastrectomy 04/07/2020     Current Outpatient Medications on File Prior to Visit  Medication Sig Dispense Refill   amLODipine (NORVASC) 10 MG tablet Take 1 tablet (10 mg total) by mouth daily. 30 tablet 1   Cholecalciferol (VITAMIN D3 PO) Take 1 tablet by mouth at bedtime.     clindamycin (CLEOCIN T) 1  % lotion Apply topically.     doxycycline (VIBRAMYCIN) 100 MG capsule Take 100 mg by mouth 2 (two) times daily. (Patient not taking: No sig reported)     escitalopram (LEXAPRO) 20 MG tablet Take 1 tablet (20 mg total) by mouth daily. 30 tablet 1   fluticasone (FLONASE) 50 MCG/ACT nasal spray Place 2 sprays into both nostrils at bedtime as needed for allergies.     MELATONIN PO Take 1 tablet by mouth at bedtime as needed (sleep).     Multiple Vitamin (MULTI-VITAMIN) tablet Take 1 tablet by mouth daily.     mupirocin ointment (BACTROBAN) 2 % Apply 1 application topically as needed.     pravastatin (PRAVACHOL) 10 MG tablet Take 1 tablet (10 mg total) by mouth at bedtime. 30 tablet 1   No current facility-administered medications on file prior to visit.    No Known Allergies  Social History   Socioeconomic History   Marital status: Single    Spouse name: Not on file   Number of children: Not on file   Years of education: Not on file   Highest education level: Not on file  Occupational History   Not on file  Tobacco Use   Smoking status: Never   Smokeless tobacco: Never  Vaping Use   Vaping Use: Never used  Substance and Sexual Activity   Alcohol use: Not Currently   Drug use: Not Currently  Sexual activity: Not on file  Other Topics Concern   Not on file  Social History Narrative   Not on file   Social Determinants of Health   Financial Resource Strain: Not on file  Food Insecurity: Not on file  Transportation Needs: Not on file  Physical Activity: Not on file  Stress: Not on file  Social Connections: Not on file  Intimate Partner Violence: Not on file    Family History  Problem Relation Age of Onset   Stroke Mother    Hypertension Mother    Diabetes Mother    Depression Mother    Colon polyps Father    Hypertension Father    Stroke Father    Diabetes Father    Colon cancer Neg Hx    Esophageal cancer Neg Hx    Rectal cancer Neg Hx    Stomach cancer Neg Hx      Past Surgical History:  Procedure Laterality Date   ABDOMINAL HYSTERECTOMY     CARPAL TUNNEL RELEASE Left    COLONOSCOPY     20 + years ago   [place] N/A 04/07/2020   Procedure: [place];  Surgeon: Johnathan Hausen, MD;  Location: [facility];  Service: General;  Laterality: N/A;   partial hsyterectomy     TUBAL LIGATION  2014   UPPER GASTROINTESTINAL ENDOSCOPY     UPPER GI ENDOSCOPY N/A 04/07/2020   Procedure: UPPER GI ENDOSCOPY;  Surgeon: Johnathan Hausen, MD;  Location: [facility];  Service: General;  Laterality: N/A;    ROS: Review of Systems Negative except as stated above  PHYSICAL EXAM: BP 121/86    Pulse 60    Resp 16    Ht 5' 4"  (1.626 m)    Wt 254 lb 6.4 oz (115.4 kg)    BMI 43.67 kg/m   Wt Readings from Last 3 Encounters:  03/06/21 254 lb 6.4 oz (115.4 kg)  02/24/21 254 lb (115.2 kg)  11/19/20 255 lb (115.7 kg)  BP 122/86  Physical Exam  General appearance - alert, well appearing, and in no distress Mental status - normal mood, behavior, speech, dress, motor activity, and thought processes Neck - supple, no significant adenopathy Chest - clear to auscultation, no wheezes, rales or rhonchi, symmetric air entry Heart - normal rate, regular rhythm, normal S1, S2, no murmurs, rubs, clicks or gallops Extremities -no lower extremity edema.   CMP Latest Ref Rng & Units 02/24/2021 04/07/2020 03/31/2020  Glucose 70 - 99 mg/dL 92 - 112(H)  BUN 6 - 20 mg/dL 8 - 13  Creatinine 0.44 - 1.00 mg/dL 0.81 0.75 0.80  Sodium 135 - 145 mmol/L 139 - 142  Potassium 3.5 - 5.1 mmol/L 3.6 - 4.0  Chloride 98 - 111 mmol/L 106 - 105  CO2 22 - 32 mmol/L 26 - 24  Calcium 8.9 - 10.3 mg/dL 8.6(L) - 9.4  Total Protein 6.5 - 8.1 g/dL - - 7.8  Total Bilirubin 0.3 - 1.2 mg/dL - - 0.6  Alkaline Phos 38 - 126 U/L - - 70  AST 15 - 41 U/L - - 33  ALT 0 - 44 U/L - - 48(H)   Lipid Panel  No results found for: CHOL, TRIG, HDL, CHOLHDL, VLDL,  LDLCALC, LDLDIRECT  CBC    Component Value Date/Time   WBC 4.4 02/24/2021 2341   RBC 5.31 (H) 02/24/2021 2341   HGB 13.3 02/24/2021 2341   HCT 40.5 02/24/2021 2341   PLT 238 02/24/2021 2341  MCV 76.3 (L) 02/24/2021 2341   MCH 25.0 (L) 02/24/2021 2341   MCHC 32.8 02/24/2021 2341   RDW 16.7 (H) 02/24/2021 2341   LYMPHSABS 1.3 04/08/2020 0512   MONOABS 0.7 04/08/2020 0512   EOSABS 0.0 04/08/2020 0512   BASOSABS 0.0 04/08/2020 0512    ASSESSMENT AND PLAN: 1. Essential hypertension Not at goal.  Patient forgot to take Norvasc today.  No changes made.  Encouraged to continue taking the medication and to check blood pressure with goal being 130/80 or lower. - amLODipine (NORVASC) 10 MG tablet; Take 1 tablet (10 mg total) by mouth daily.  Dispense: 90 tablet; Refill: 1  2. Obesity, Class III, BMI 40-49.9 (morbid obesity) ([place]) Commended her on weight loss so far.  Strongly encouraged her to get back on track with her eating habits and trying to exercise more to achieve further weight loss.  We discussed the importance of meal planning.  3. Mixed hyperlipidemia - pravastatin (PRAVACHOL) 10 MG tablet; Take 1 tablet (10 mg total) by mouth at bedtime.  Dispense: 90 tablet; Refill: 1  4. Chest pain, unspecified type Patient will let me know if she has any further episodes.  If she does we will refer to cardiology.  5.  Depressive disorder in remission.  Doing well on Lexapro.  Refill sent to her pharmacy.   Patient was given the opportunity to ask questions.  Patient verbalized understanding of the plan and was able to repeat key elements of the plan.   No orders of the defined types were placed in this encounter.    Requested Prescriptions    No prescriptions requested or ordered in this encounter    No follow-ups on file.  Karle Plumber, MD, FACP

## 2021-03-16 ENCOUNTER — Telehealth: Payer: Self-pay | Admitting: Internal Medicine

## 2021-03-16 DIAGNOSIS — R002 Palpitations: Secondary | ICD-10-CM

## 2021-03-16 DIAGNOSIS — R079 Chest pain, unspecified: Secondary | ICD-10-CM

## 2021-03-16 NOTE — Telephone Encounter (Signed)
Referral Request - Has patient seen PCP for this complaint? Yes.   *If NO, is insurance requiring patient see PCP for this issue before PCP can refer them? Referral for which specialty: Heartcare Preferred provider/office: any Reason for referral: pt was to let Dr Wynetta Emery know if her heart palpitations and pains did not better. They have not and she would like to make this Urgent referral to heart dr.

## 2021-03-16 NOTE — Telephone Encounter (Signed)
Will forward to provider  

## 2021-03-17 DIAGNOSIS — F32A Depression, unspecified: Secondary | ICD-10-CM | POA: Insufficient documentation

## 2021-03-17 DIAGNOSIS — M1990 Unspecified osteoarthritis, unspecified site: Secondary | ICD-10-CM | POA: Insufficient documentation

## 2021-03-17 DIAGNOSIS — R7303 Prediabetes: Secondary | ICD-10-CM | POA: Insufficient documentation

## 2021-03-17 DIAGNOSIS — T7840XA Allergy, unspecified, initial encounter: Secondary | ICD-10-CM | POA: Insufficient documentation

## 2021-03-18 ENCOUNTER — Encounter: Payer: Self-pay | Admitting: Cardiology

## 2021-03-18 ENCOUNTER — Other Ambulatory Visit: Payer: Self-pay

## 2021-03-18 ENCOUNTER — Ambulatory Visit (INDEPENDENT_AMBULATORY_CARE_PROVIDER_SITE_OTHER): Payer: Managed Care, Other (non HMO)

## 2021-03-18 ENCOUNTER — Ambulatory Visit: Payer: Managed Care, Other (non HMO) | Admitting: Cardiology

## 2021-03-18 VITALS — BP 128/86 | HR 76 | Ht 64.0 in | Wt 256.0 lb

## 2021-03-18 DIAGNOSIS — R7303 Prediabetes: Secondary | ICD-10-CM

## 2021-03-18 DIAGNOSIS — R002 Palpitations: Secondary | ICD-10-CM | POA: Diagnosis not present

## 2021-03-18 DIAGNOSIS — R0789 Other chest pain: Secondary | ICD-10-CM | POA: Insufficient documentation

## 2021-03-18 DIAGNOSIS — I10 Essential (primary) hypertension: Secondary | ICD-10-CM | POA: Diagnosis not present

## 2021-03-18 DIAGNOSIS — Z9884 Bariatric surgery status: Secondary | ICD-10-CM

## 2021-03-18 DIAGNOSIS — R079 Chest pain, unspecified: Secondary | ICD-10-CM

## 2021-03-18 MED ORDER — ASPIRIN EC 81 MG PO TBEC: 81.0000 mg | DELAYED_RELEASE_TABLET | Freq: Every day | ORAL | 3 refills | Status: AC

## 2021-03-18 MED ORDER — METOPROLOL TARTRATE 100 MG PO TABS: 100.0000 mg | ORAL_TABLET | Freq: Once | ORAL | 0 refills | Status: DC

## 2021-03-18 NOTE — Patient Instructions (Signed)
Medication Instructions:  Your physician has recommended you make the following change in your medication:   START: Aspirin 81 mg daily   *If you need a refill on your cardiac medications before your next appointment, please call your pharmacy*   Lab Work:  BMP 1 week before CT  If you have labs (blood work) drawn today and your tests are completely normal, you will receive your results only by: [place] (if you have MyChart) OR A paper copy in the mail If you have any lab test that is abnormal or we need to change your treatment, we will call you to review the results.   Testing/Procedures: A zio monitor was ordered today. It will remain on for 14 days. You will then return monitor and event diary in provided box. It takes 1-2 weeks for report to be downloaded and returned to [place]. We will call you with the results. If monitor falls off or has orange flashing light, please call [employer] for further instructions.     Your cardiac CT will be scheduled at one of the below locations:   [facility] [address] [place], [place] [address] [phone]  [facility] [address] [place], [place] [address] [phone]  If scheduled at [facility], please arrive at the [facility] main entrance (entrance A) of [facility] 30 minutes prior to test start time. You can use the FREE valet parking offered at the main entrance (encouraged to control the heart rate for the test) Proceed to the [facility] Radiology Department (first floor) to check-in and test prep.  If scheduled at [facility], please arrive 15 mins early for check-in and test prep.  Please follow these instructions carefully (unless otherwise directed):  On the Night Before the Test: Be sure to Drink plenty of water. Do not consume any caffeinated/decaffeinated beverages or chocolate 12 hours prior to your  test. Do not take any antihistamines 12 hours prior to your test.  On the Day of the Test: Drink plenty of water until 1 hour prior to the test. Do not eat any food 4 hours prior to the test. You may take your regular medications prior to the test.  Take metoprolol (Lopressor) two hours prior to test. FEMALES- please wear underwire-free bra if available, avoid dresses & tight clothing       After the Test: Drink plenty of water. After receiving IV contrast, you may experience a mild flushed feeling. This is normal. On occasion, you may experience a mild rash up to 24 hours after the test. This is not dangerous. If this occurs, you can take Benadryl 25 mg and increase your fluid intake. If you experience trouble breathing, this can be serious. If it is severe call 911 IMMEDIATELY. If it is mild, please call our office.   We will call to schedule your test 2-4 weeks out understanding that some insurance companies will need an authorization prior to the service being performed.   For non-scheduling related questions, please contact the cardiac imaging nurse navigator should you have any questions/concerns: Marchia Bond, Cardiac Imaging Nurse Navigator Gordy Clement, Cardiac Imaging Nurse Navigator [facility] Direct Office Dial: [phone]   For scheduling needs, including cancellations and rescheduling, please call [place], [phone].    Follow-Up: At [facility], you and your health needs are our priority.  As part of our continuing mission to provide you with  exceptional heart care, we have created designated Provider Care Teams.  These Care Teams include your primary Cardiologist (physician) and Advanced Practice Providers (APPs -  Physician Assistants and Nurse Practitioners) who all work together to provide you with the care you need, when you need it.  We recommend signing up for the patient portal called "MyChart".  Sign up information is  provided on this After Visit Summary.  MyChart is used to connect with patients for Virtual Visits (Telemedicine).  Patients are able to view lab/test results, encounter notes, upcoming appointments, etc.  Non-urgent messages can be sent to your provider as well.   To learn more about what you can do with MyChart, go to [email or web address].    Your next appointment:   2 month(s)  The format for your next appointment:   In Person  Provider:   Jenne Campus, MD    Other Instructions None

## 2021-03-18 NOTE — Progress Notes (Signed)
Cardiology Consultation:    Date:  03/18/2021   ID:  Pamela Simon, DOB 11-16-1970, MRN [id number]  PCP:  Pamela Pier, MD  Cardiologist:  Pamela Campus, MD   Referring MD: Pamela Pier, MD   No chief complaint on file. I have palpitations and chest pain  History of Present Illness:    Pamela Simon is a 51 y.o. female who is being seen today for the evaluation of palpitation chest pain at the request of Pamela Pier, MD. morbidly obese woman with her gastric sleeve surgery done in March 2022 with significant loss of weight thereafter.  Does have history of essential hypertension, dyslipidemia, depression, prediabetic.  She was referred to [place] because of episode of chest pain.  As well as palpitation.  Apparently all symptoms started at the end of January.  She started a new job which is quite stressful she [place] to [place] 3 times [place] which is a long drive that she does not like.  She describes episode of palpitation when she feel her heart speeding up she cannot tell me if it is regular or irregular when it happened she feels very weak but not to the point of passing out.  There is no shortness of breath no sweating but she will get some chest sensation that she felt chest tightness that happened during the time.  She had few episodes of this sensation.  Eventually end up going to the emergency room I did review visit from the emergency room.  Troponin I were negative and all tests were negative including EKG showing no acute changes.  She was discharged from the emergency room and asked to follow-up with [place].  She does not exercise on the regular basis, she is trying but now she is afraid because of her symptomatology.  She never smoked she does have family history of premature coronary artery disease.  Past Medical History:  Diagnosis Date   Allergy    seasonal   Arthritis    knees   Depression    Hyperlipidemia    Hypertension    Pre-diabetes      Past Surgical History:  Procedure Laterality Date   ABDOMINAL HYSTERECTOMY     CARPAL TUNNEL RELEASE Left    COLONOSCOPY     20 + years ago   [place] N/A 04/07/2020   Procedure: LAPAROSCOPIC GASTRIC SLEEVE RESECTION;  Surgeon: Johnathan Hausen, MD;  Location: [facility];  Service: General;  Laterality: N/A;   partial hsyterectomy     TUBAL LIGATION  2014   UPPER GASTROINTESTINAL ENDOSCOPY     UPPER GI ENDOSCOPY N/A 04/07/2020   Procedure: UPPER GI ENDOSCOPY;  Surgeon: Johnathan Hausen, MD;  Location: [facility];  Service: General;  Laterality: N/A;    Current Medications: Current Meds  Medication Sig   amLODipine (NORVASC) 10 MG tablet Take 1 tablet (10 mg total) by mouth daily.   BIOTIN PO Take 2,000 mg by mouth in the morning.   calcium carbonate (TUMS - DOSED IN MG ELEMENTAL CALCIUM) 500 MG chewable tablet Chew 1 tablet by mouth 3 (three) times daily.   cholecalciferol (VITAMIN D) 25 MCG (1000 UNIT) tablet Take 3 tablets by mouth daily.   clindamycin (CLEOCIN T) 1 % lotion Apply topically.   diphenhydramine-acetaminophen (TYLENOL PM EXTRA STRENGTH) 25-500 MG TABS tablet Take 2 tablets by mouth at bedtime as needed (sleep).   escitalopram (LEXAPRO) 20 MG tablet Take 1 tablet (20 mg total)  by mouth daily.   fluticasone (FLONASE) 50 MCG/ACT nasal spray Place 2 sprays into both nostrils at bedtime as needed for allergies.   MELATONIN PO Take 1 tablet by mouth at bedtime as needed (sleep).   Multiple Vitamin (MULTI-VITAMIN) tablet Take 1 tablet by mouth daily.   Multiple Vitamins-Minerals (BARIATRIC MULTIVITAMINS/IRON) CAPS Take 1 capsule by mouth daily.   mupirocin ointment (BACTROBAN) 2 % Apply 1 application topically as needed (wound).   pravastatin (PRAVACHOL) 10 MG tablet Take 1 tablet (10 mg total) by mouth at bedtime.   vitamin C (ASCORBIC ACID) 500 MG tablet Take 500 mg by mouth daily.     Allergies:   Patient has no known allergies.   Social  History   Socioeconomic History   Marital status: Single    Spouse name: Not on file   Number of children: Not on file   Years of education: Not on file   Highest education level: Not on file  Occupational History   Not on file  Tobacco Use   Smoking status: Never    Passive exposure: Never   Smokeless tobacco: Never  Vaping Use   Vaping Use: Never used  Substance and Sexual Activity   Alcohol use: Not Currently   Drug use: Not Currently   Sexual activity: Not on file  Other Topics Concern   Not on file  Social History Narrative   Not on file   Social Determinants of Health   Financial Resource Strain: Not on file  Food Insecurity: Not on file  Transportation Needs: Not on file  Physical Activity: Not on file  Stress: Not on file  Social Connections: Not on file     Family History: The patient's family history includes Colon polyps in her father; Depression in her mother; Diabetes in her father and mother; Hypertension in her father and mother; Stroke in her father and mother. There is no history of Colon cancer, Esophageal cancer, Rectal cancer, or Stomach cancer. ROS:   Please see the history of present illness.    All 14 point review of systems negative except as described per history of present illness.  EKGs/Labs/Other Studies Reviewed:    The following studies were reviewed today: Record from emergency room reviewed  EKG:  EKG is  ordered today.  The ekg ordered today demonstrates normal sinus rhythm, normal P interval, more of a progression anterior precordium, no ST segment changes  Recent Labs: 03/31/2020: ALT 48 02/24/2021: BUN 8; Creatinine, Ser 0.81; Hemoglobin 13.3; Platelets 238; Potassium 3.6; Sodium 139  Recent Lipid Panel No results found for: CHOL, TRIG, HDL, CHOLHDL, VLDL, LDLCALC, LDLDIRECT  Physical Exam:    VS:  BP 128/86 (BP Location: Left Arm)    Pulse 76    Ht 5' 4"  (1.626 m)    Wt 256 lb (116.1 kg)    SpO2 97%    BMI 43.94 kg/m     Wt  Readings from Last 3 Encounters:  03/18/21 256 lb (116.1 kg)  03/06/21 254 lb 6.4 oz (115.4 kg)  02/24/21 254 lb (115.2 kg)     GEN:  Well nourished, well developed in no acute distress HEENT: Normal NECK: No JVD; No carotid bruits LYMPHATICS: No lymphadenopathy CARDIAC: RRR, no murmurs, no rubs, no gallops RESPIRATORY:  Clear to auscultation without rales, wheezing or rhonchi  ABDOMEN: Soft, non-tender, non-distended MUSCULOSKELETAL:  No edema; No deformity  SKIN: Warm and dry NEUROLOGIC:  Alert and oriented x 3 PSYCHIATRIC:  Normal affect   ASSESSMENT:  1. Essential hypertension   2. Atypical chest pain   3. Palpitations   4. Pre-diabetes   5. Status post laparoscopic sleeve gastrectomy    PLAN:    In order of problems listed above:  Atypical chest pain.  Pain is partially reproducible pressing chest wall, she does have significant risk factors for coronary artery disease therefore I think we need to evaluate for coronary artery disease.  We talked about different options I think his situation the best option will be to pursue coronary CT angio.  I described procedure to her and she is willing to proceed.  In the meantime I asked him to start taking 1 baby aspirin every single day and also asked her not to push herself too hard. Palpitations: We will put Zio patch for 2 weeks to see exactly what kind of arrhythmia we dealing with.  I will not treat this symptomatically continues what the diagnosis is.  She reports an issue of potentially having anxiety which cause her symptomatology which could be very well case however I told her to the diagnosis of anxiety have to be diagnosed with exclusions.  We need to do monitoring test before committing to diagnosis of anxiety. Prediabetes she is doing much better from that.  After she lost significant amount of weight Dyslipidemia he is taking pravastatin at a low dose, her lipid profile from 9 months ago show LDL of 89 HDL 54.  We will  not change any of her medication for now we will wait for results of her test on top of that her LDL probably need to be rechecked now after significant weight loss. Obstructive sleep apnea plans to check with before surgery for weight loss and she did not have that problem   Medication Adjustments/Labs and Tests Ordered: Current medicines are reviewed at length with the patient today.  Concerns regarding medicines are outlined above.  No orders of the defined types were placed in this encounter.  No orders of the defined types were placed in this encounter.   Signed, Park Liter, MD, [facility]. 03/18/2021 10:04 AM    [place]

## 2021-03-18 NOTE — Addendum Note (Signed)
Addended by: Edwyna Shell I on: 03/18/2021 10:40 AM   Modules accepted: Orders

## 2021-03-31 ENCOUNTER — Telehealth (HOSPITAL_COMMUNITY): Payer: Self-pay | Admitting: Emergency Medicine

## 2021-03-31 DIAGNOSIS — R0789 Other chest pain: Secondary | ICD-10-CM

## 2021-03-31 MED ORDER — METOPROLOL TARTRATE 100 MG PO TABS: 100.0000 mg | ORAL_TABLET | Freq: Once | ORAL | 0 refills | Status: DC

## 2021-03-31 NOTE — Telephone Encounter (Signed)
Reaching out to patient to offer assistance regarding upcoming cardiac imaging study; pt verbalizes understanding of appt date/time, parking situation and where to check in, pre-test NPO status and medications ordered, and verified current allergies; name and call back number provided for further questions should they arise ?Marchia Bond RN Navigator Cardiac Imaging ?[facility] ?[phone] office ?[phone] cell ? ?157m metoprolol  ?Denies iv issues ?Arrival 730 ?

## 2021-04-01 ENCOUNTER — Other Ambulatory Visit: Payer: Self-pay

## 2021-04-01 ENCOUNTER — Encounter (HOSPITAL_COMMUNITY): Payer: Self-pay

## 2021-04-01 ENCOUNTER — Ambulatory Visit (HOSPITAL_COMMUNITY)
Admission: RE | Admit: 2021-04-01 | Discharge: 2021-04-01 | Disposition: A | Discharge status: Home / Self Care | Payer: Managed Care, Other (non HMO) | Source: Ambulatory Visit | Attending: Cardiology | Admitting: Cardiology

## 2021-04-01 DIAGNOSIS — R079 Chest pain, unspecified: Secondary | ICD-10-CM | POA: Insufficient documentation

## 2021-04-01 MED ORDER — IOHEXOL 350 MG/ML SOLN
100.0000 mL | Freq: Once | INTRAVENOUS | Status: AC | PRN
  Administered 2021-04-01: 100 mL via INTRAVENOUS

## 2021-04-01 MED ORDER — NITROGLYCERIN 0.4 MG SL SUBL
SUBLINGUAL_TABLET | SUBLINGUAL | Status: AC
  Filled 2021-04-01: qty 2

## 2021-04-01 MED ORDER — NITROGLYCERIN 0.4 MG SL SUBL
0.8000 mg | SUBLINGUAL_TABLET | Freq: Once | SUBLINGUAL | Status: AC
  Administered 2021-04-01: 0.8 mg via SUBLINGUAL

## 2021-04-02 ENCOUNTER — Encounter: Payer: Self-pay | Admitting: Cardiology

## 2021-05-15 ENCOUNTER — Ambulatory Visit: Payer: Managed Care, Other (non HMO) | Admitting: Cardiology

## 2021-06-09 ENCOUNTER — Ambulatory Visit: Payer: Self-pay

## 2021-06-09 NOTE — Telephone Encounter (Signed)
?  Chief Complaint: panic attacks ?Symptoms: panic attack, heart racing, SOB, feeling flushed ?Frequency: every day when driving to and from work ?Pertinent Negatives: Patient denies it happening any other time ?Disposition: [] ED /[] Urgent Care (no appt availability in office) / [x] Appointment(In office/virtual)/ []  [facility]/ [] Home Care/ [] Refused Recommended Disposition /[] [place] [facility]/ []  Follow-up with PCP ?Additional Notes: pt was seen previously for this and was told by EAP at work that she needs to see [facility] provider for this. Pt scheduled VV appt 06/16/21 with Army Melia, NP. Also scheduled 6 month f/up with PCP.  ? ?Reason for Disposition ? [1] Symptoms of anxiety or panic attack AND [2] is a chronic symptom (recurrent or ongoing AND present > 4 weeks) ? ?Answer Assessment - Initial Assessment Questions ?1. DESCRIPTION: "Please describe your heart rate or heartbeat that you are having" (e.g., fast/slow, regular/irregular, skipped or extra beats, "palpitations") ?    Palpitations d/t anxiety  ?2. ONSET: "When did it start?" (Minutes, hours or days)  ?    Several months ?4. PATTERN "Does it come and go, or has it been constant since it started?"  "Does it get worse with exertion?"   "Are you feeling it now?" ?    Every day while driving to and from work  ?8. CAUSE: "What do you think is causing the palpitations?" ?    Feeling anxiety related  ?9. CARDIAC HISTORY: "Do you have any history of heart disease?" (e.g., heart attack, angina, bypass surgery, angioplasty, arrhythmia)  ?    No ?10. OTHER SYMPTOMS: "Do you have any other symptoms?" (e.g., dizziness, chest pain, sweating, difficulty breathing) ?      Heart racing, SOB a ? ?Protocols used: Heart Rate and Heartbeat Questions-A-AH, Anxiety and Panic Attack-A-AH ? ?

## 2021-06-16 ENCOUNTER — Encounter: Payer: Self-pay | Admitting: Nurse Practitioner

## 2021-06-16 ENCOUNTER — Ambulatory Visit: Payer: Managed Care, Other (non HMO) | Attending: Physician Assistant | Admitting: Nurse Practitioner

## 2021-06-16 DIAGNOSIS — F419 Anxiety disorder, unspecified: Secondary | ICD-10-CM | POA: Diagnosis not present

## 2021-06-16 DIAGNOSIS — F32A Depression, unspecified: Secondary | ICD-10-CM

## 2021-06-16 NOTE — Progress Notes (Signed)
Virtual Visit Note  I discussed the limitations, risks, security and privacy concerns of performing an evaluation and management service by video and the availability of in person appointments. I also discussed with the patient that there may be a patient responsible charge related to this service. The patient expressed understanding and agreed to proceed.    I connected with Pamela Simon on 06/16/21  at   2:00 PM EDT  EDT by VIDEO and verified that I am speaking with the correct person using two identifiers.   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 She had a cardiac work up which was essentially normal. Her work commute is over an hour each way. She is currently working from home 2 days out of the work week but would like to work an additional day from home or change her work hours (when there is lighter traffic flow) to help reduce her anxiety symptoms while driving. States CBD oil has been somewhat effective however she ran out and is not currently taking.  Her EAP representative through her employer recommended she obtain a letter from a psychiatrist or her PCP for work accommodations. I will place referral to psychiatry. She is aware this may take some time for scheduling. She will also reach out to her PCP regarding this as well. She is also currently taking lexapro 38m daily as prescribed for MDD.     Past Medical History:  Diagnosis Date   Allergy    seasonal   Arthritis    knees   Depression    Hyperlipidemia    Hypertension    Pre-diabetes     Past Surgical History:  Procedure Laterality Date   ABDOMINAL  HYSTERECTOMY     CARPAL TUNNEL RELEASE Left    COLONOSCOPY     20 + years ago   LAPAROSCOPIC GASTRIC SLEEVE RESECTION N/A 04/07/2020   Procedure: LAPAROSCOPIC GASTRIC SLEEVE RESECTION;  Surgeon: MJohnathan Hausen MD;  Location: [facility];  Service: General;  Laterality: N/A;   partial hsyterectomy     TUBAL LIGATION  2014   UPPER GASTROINTESTINAL ENDOSCOPY     UPPER GI ENDOSCOPY N/A 04/07/2020   Procedure: UPPER GI ENDOSCOPY;  Surgeon: MJohnathan Hausen MD;  Location: [facility];  Service: General;  Laterality: N/A;    Family History  Problem Relation Age of Onset   Stroke Mother    Hypertension Mother    Diabetes Mother    Depression Mother    Colon polyps Father    Hypertension Father    Stroke Father    Diabetes Father    Colon cancer Neg Hx    Esophageal cancer Neg Hx    Rectal cancer Neg Hx    Stomach cancer Neg Hx     Social History   Socioeconomic History   Marital status: Single    Spouse name: Not on file   Number of children: Not on file   Years of education: Not on file   Highest education level: Not on file  Occupational History   Not on file  Tobacco Use   Smoking status: Never    Passive exposure: Never   Smokeless tobacco: Never  Vaping  Use   Vaping Use: Never used  Substance and Sexual Activity   Alcohol use: Not Currently   Drug use: Not Currently   Sexual activity: Not on file  Other Topics Concern   Not on file  Social History Narrative   Not on file   Social Determinants of Health   Financial Resource Strain: Not on file  Food Insecurity: Not on file  Transportation Needs: Not on file  Physical Activity: Not on file  Stress: Not on file  Social Connections: Not on file     Observations/Objective: Awake, alert and oriented x 3   Review of Systems  Constitutional:  Negative for fever, malaise/fatigue and weight loss.  HENT: Negative.  Negative for nosebleeds.   Eyes: Negative.  Negative for blurred vision, double vision and photophobia.   Respiratory: Negative.  Negative for cough and shortness of breath.   Cardiovascular: Negative.  Negative for chest pain, palpitations and leg swelling.  Gastrointestinal: Negative.  Negative for heartburn, nausea and vomiting.  Musculoskeletal: Negative.  Negative for myalgias.  Neurological: Negative.  Negative for dizziness, focal weakness, seizures and headaches.  Psychiatric/Behavioral:  Positive for depression. Negative for suicidal ideas. The patient is nervous/anxious.    Assessment and Plan: Diagnoses and all orders for this visit:  Anxiety and depression -     Ambulatory referral to Psychiatry Continue lexapro as prescribed     Follow Up Instructions Return if symptoms worsen or fail to improve.     I discussed the assessment and treatment plan with the patient. The patient was provided an opportunity to ask questions and all were answered. The patient agreed with the plan and demonstrated an understanding of the instructions.   The patient was advised to call back or seek an in-person evaluation if the symptoms worsen or if the condition fails to improve as anticipated.  I provided 12 minutes of face-to-face time during this encounter including median intraservice time, reviewing previous notes, labs, imaging, medications and explaining diagnosis and management.  Gildardo Pounds, FNP-BC

## 2021-06-27 ENCOUNTER — Other Ambulatory Visit: Payer: Self-pay | Admitting: Internal Medicine

## 2021-06-27 DIAGNOSIS — I10 Essential (primary) hypertension: Secondary | ICD-10-CM

## 2021-06-29 ENCOUNTER — Other Ambulatory Visit: Payer: Self-pay | Admitting: Internal Medicine

## 2021-06-29 DIAGNOSIS — I10 Essential (primary) hypertension: Secondary | ICD-10-CM

## 2021-06-29 NOTE — Telephone Encounter (Signed)
Pts refills wont be delivered until 6.13.22/ pt is asking if she can get at least a weeks worth of each RX below sent to the [facility]  until her medication is delivered / amLODipine (NORVASC) 10 MG tablet  and  escitalopram (LEXAPRO) 20 MG tablet    Sent to  [facility] [place], [place] [facility] [place] Phone:  [phone]  Fax:  [phone]

## 2021-06-29 NOTE — Telephone Encounter (Unsigned)
Copied from [place] [id number]. Topic: General - Other >> Jun 29, 2021 12:07 PM Tessa Lerner A wrote: Reason for CRM: Medication Refill - Medication: amLODipine (NORVASC) 10 MG tablet [[id number]  escitalopram (LEXAPRO) 20 MG tablet [[id number]]   Has the patient contacted their pharmacy? Yes.  The medications were previously submitted to the wrong pharmacy  (Agent: If no, request that the patient contact the pharmacy for the refill. If patient does not wish to contact the pharmacy document the reason why and proceed with request.) (Agent: If yes, when and what did the pharmacy advise?)  Preferred Pharmacy (with phone number or street name): [facility], [place] [address] [place] [address] Phone: [phone] Fax: [phone] Hours: Not open 24 hours  Has the patient been seen for an appointment in the last year OR does the patient have an upcoming appointment? Yes.    Agent: Please be advised that RX refills may take up to 3 business days. We ask that you follow-up with your pharmacy.

## 2021-06-29 NOTE — Telephone Encounter (Signed)
Requested Prescriptions  Pending Prescriptions Disp Refills  . amLODipine (NORVASC) 10 MG tablet [Pharmacy Med Name: amLODIPine Besylate 10 MG Oral Tablet] 30 tablet 0    Sig: TAKE 1 TABLET BY MOUTH ONCE DAILY . APPOINTMENT REQUIRED FOR FUTURE REFILLS     Cardiovascular: Calcium Channel Blockers 2 Passed - 06/29/2021  8:09 AM      Passed - Last BP in normal range    BP Readings from Last 1 Encounters:  04/01/21 110/71         Passed - Last Heart Rate in normal range    Pulse Readings from Last 1 Encounters:  03/18/21 76         Passed - Valid encounter within last 6 months    Recent Outpatient Visits          1 week ago Anxiety and depression   [place], Vernia Buff, NP   3 months ago Essential hypertension   [place], MD   10 months ago Encounter to establish care   [place], MD      Future Appointments            In 2 months Wynetta Emery Dalbert Batman, MD [place]           . escitalopram (LEXAPRO) 20 MG tablet [Pharmacy Med Name: Escitalopram Oxalate 20 MG Oral Tablet] 30 tablet 0    Sig: Take 1 tablet by mouth once daily     Psychiatry:  Antidepressants - SSRI Passed - 06/29/2021  8:09 AM      Passed - Completed PHQ-2 or PHQ-9 in the last 360 days      Passed - Valid encounter within last 6 months    Recent Outpatient Visits          1 week ago Anxiety and depression   [place], Vernia Buff, NP   3 months ago Essential hypertension   [place], MD   10 months ago Encounter to establish care   [place], MD      Future Appointments            In 2 months Wynetta Emery Dalbert Batman, MD [place]

## 2021-06-30 NOTE — Telephone Encounter (Signed)
Refilled 06/29/21.

## 2021-07-02 ENCOUNTER — Ambulatory Visit: Payer: Managed Care, Other (non HMO) | Admitting: Physician Assistant

## 2021-07-31 ENCOUNTER — Ambulatory Visit: Payer: Self-pay | Admitting: *Deleted

## 2021-07-31 DIAGNOSIS — Z1231 Encounter for screening mammogram for malignant neoplasm of breast: Secondary | ICD-10-CM

## 2021-07-31 NOTE — Telephone Encounter (Signed)
Reason for Disposition . [1] Caller requesting NON-URGENT health information AND [2] PCP's office is the best resource  Answer Assessment - Initial Assessment Questions 1. REASON FOR CALL or QUESTION: "What is your reason for calling today?" or "How can I best help you?" or "What question do you have that I can help answer?"     Last MM- 05/07/19     [facility] Can she get order for screening MM for PCP- patient feels she does snot need GYN due to history of hysterectomy and no problems at this time.  Protocols used: Information Only Call - No Triage-A-AH

## 2021-07-31 NOTE — Telephone Encounter (Signed)
Summary: Pamela Simon   Patient would like to know when her last Pamela Simon was done.       Called patient to review last documented mammogram that was on 05/07/19. No answer, [facility] [phone].

## 2021-08-03 ENCOUNTER — Encounter: Payer: Self-pay | Admitting: Internal Medicine

## 2021-08-03 NOTE — Telephone Encounter (Signed)
[facility] referral submitted.

## 2021-08-03 NOTE — Addendum Note (Signed)
Addended by: Jonah Blue B on: 08/03/2021 09:03 PM   Modules accepted: Orders

## 2021-08-20 ENCOUNTER — Ambulatory Visit
Admission: RE | Admit: 2021-08-20 | Discharge: 2021-08-20 | Disposition: A | Discharge status: Home / Self Care | Payer: Managed Care, Other (non HMO) | Source: Ambulatory Visit | Attending: Internal Medicine | Admitting: Internal Medicine

## 2021-08-20 DIAGNOSIS — Z1231 Encounter for screening mammogram for malignant neoplasm of breast: Secondary | ICD-10-CM

## 2021-09-07 ENCOUNTER — Other Ambulatory Visit: Payer: Self-pay | Admitting: Internal Medicine

## 2021-09-07 DIAGNOSIS — E782 Mixed hyperlipidemia: Secondary | ICD-10-CM

## 2021-09-09 ENCOUNTER — Other Ambulatory Visit: Payer: Self-pay | Admitting: Internal Medicine

## 2021-09-09 DIAGNOSIS — I10 Essential (primary) hypertension: Secondary | ICD-10-CM

## 2021-09-24 ENCOUNTER — Ambulatory Visit: Payer: Managed Care, Other (non HMO) | Attending: Internal Medicine | Admitting: Internal Medicine

## 2021-09-24 ENCOUNTER — Encounter: Payer: Self-pay | Admitting: Internal Medicine

## 2021-09-24 VITALS — BP 119/84 | HR 87 | Temp 97.5°F | Ht 64.0 in | Wt 275.2 lb

## 2021-09-24 DIAGNOSIS — R718 Other abnormality of red blood cells: Secondary | ICD-10-CM

## 2021-09-24 DIAGNOSIS — F419 Anxiety disorder, unspecified: Secondary | ICD-10-CM

## 2021-09-24 DIAGNOSIS — R4789 Other speech disturbances: Secondary | ICD-10-CM | POA: Diagnosis not present

## 2021-09-24 DIAGNOSIS — Z114 Encounter for screening for human immunodeficiency virus [HIV]: Secondary | ICD-10-CM

## 2021-09-24 DIAGNOSIS — E782 Mixed hyperlipidemia: Secondary | ICD-10-CM | POA: Diagnosis not present

## 2021-09-24 DIAGNOSIS — I10 Essential (primary) hypertension: Secondary | ICD-10-CM | POA: Diagnosis not present

## 2021-09-24 DIAGNOSIS — F32A Depression, unspecified: Secondary | ICD-10-CM

## 2021-09-24 DIAGNOSIS — Z2821 Immunization not carried out because of patient refusal: Secondary | ICD-10-CM

## 2021-09-24 MED ORDER — AMLODIPINE BESYLATE 10 MG PO TABS: 10.0000 mg | ORAL_TABLET | Freq: Every day | ORAL | 1 refills | Status: DC

## 2021-09-24 MED ORDER — PRAVASTATIN SODIUM 10 MG PO TABS
10.0000 mg | ORAL_TABLET | Freq: Every day | ORAL | 1 refills | Status: DC
Start: 2021-09-24 — End: 2022-05-31

## 2021-09-24 NOTE — Progress Notes (Addendum)
Patient ID: Pamela Simon, female    DOB: Jun 09, 1970  MRN: [id number]  CC: Chronic disease management   Subjective: Pamela Simon is a 51 y.o. female who presents for chronic ds management Her concerns today include:  Patient with history of HTN, HL, prediabetes, obesity status post bariatric surgery (03/2020 -sleeve), MDD, GERD, OA BL knee,  Patient complains of problems getting her words out sometimes over the past 3 weeks.  She knows what she wants to say but feels like the words do not come out.  This has been associated with feeling of mental cloudiness where she has problems focusing and getting on task when she gets to work.  She has no problems completing task but finds that it takes her longer and any interruption throws her off.  She is concerned about this because mother and sister have had strokes in the past. She wonders whether anxiety may be playing a role as well.  She has increased anxiety with her commute to work every day from [place] to [place].  She gets anxious thinking about the traffic and whether she will be late.  She has been taking some CBD oil before driving to work in the mornings and this has helped to keep her calm.  She has made an appointment with a BH specialist at [facility] for 10/01/2021.  She feels the depression is under pretty good control.  HTN: Reports compliance with Norvasc.  She states her blood pressure has been good.  She checks it periodically and range has been 120-135/ 70s.  Donates plasma twice a week and it is checked there as well and range has been good.  Obesity: She has gained 19 pounds over the past 4 months.  She attributes this to not being as active due to 1314-hour days that involves commute to and from [place] to [place].  She had been eating out a lot.  She recently started walking for 10 minutes 2 times a day on her breaks at work.  Over the past 2 weeks she has cut back on eating out.  She  carries her lunch from home.  Eating more raw fruits and vegetables.  HL: Reports compliance with pravastatin.  HM: Declines Tdap, flu vaccine and shingles vaccine.  She requests screening for HIV.  Declines screening for hepatitis C.  Patient Active Problem List   Diagnosis Date Noted   Atypical chest pain 03/18/2021   Palpitations 03/18/2021   Pre-diabetes 03/17/2021   Depression 03/17/2021   Arthritis 03/17/2021   Allergy 03/17/2021   Mixed hyperlipidemia 08/21/2020   Obesity, Class III, BMI 40-49.9 (morbid obesity) (HCC) 08/21/2020   Essential hypertension 08/21/2020   Primary osteoarthritis of both knees 08/21/2020   History of depression 08/21/2020   Obesity 04/07/2020   Status post laparoscopic sleeve gastrectomy 04/07/2020     Current Outpatient Medications on File Prior to Visit  Medication Sig Dispense Refill   aspirin EC 81 MG tablet Take 1 tablet (81 mg total) by mouth daily. Swallow whole. 90 tablet 3   BIOTIN PO Take 2,000 mg by mouth in the morning.     calcium carbonate (TUMS - DOSED IN MG ELEMENTAL CALCIUM) 500 MG chewable tablet Chew 1 tablet by mouth 3 (three) times daily.     cholecalciferol (VITAMIN D) 25 MCG (1000 UNIT) tablet Take 3 tablets by mouth daily.     clindamycin (CLEOCIN T) 1 % lotion Apply topically.     diphenhydramine-acetaminophen (TYLENOL PM EXTRA STRENGTH)  25-500 MG TABS tablet Take 2 tablets by mouth at bedtime as needed (sleep).     escitalopram (LEXAPRO) 20 MG tablet TAKE 1 TABLET DAILY 90 tablet 0   fluticasone (FLONASE) 50 MCG/ACT nasal spray Place 2 sprays into both nostrils at bedtime as needed for allergies. 15.8 mL 4   MELATONIN PO Take 1 tablet by mouth at bedtime as needed (sleep).     Multiple Vitamins-Minerals (BARIATRIC MULTIVITAMINS/IRON) CAPS Take 1 capsule by mouth daily.     mupirocin ointment (BACTROBAN) 2 % Apply 1 application topically as needed (wound).     vitamin C (ASCORBIC ACID) 500 MG tablet Take 500 mg by mouth  daily.     metoprolol tartrate (LOPRESSOR) 100 MG tablet Take 1 tablet (100 mg total) by mouth once for 1 dose. 1 tablet 0   Multiple Vitamin (MULTI-VITAMIN) tablet Take 1 tablet by mouth daily. (Patient not taking: Reported on 09/24/2021)     No current facility-administered medications on file prior to visit.    No Known Allergies  Social History   Socioeconomic History   Marital status: Single    Spouse name: Not on file   Number of children: Not on file   Years of education: Not on file   Highest education level: Not on file  Occupational History   Not on file  Tobacco Use   Smoking status: Never    Passive exposure: Never   Smokeless tobacco: Never  Vaping Use   Vaping Use: Never used  Substance and Sexual Activity   Alcohol use: Not Currently   Drug use: Not Currently   Sexual activity: Not on file  Other Topics Concern   Not on file  Social History Narrative   Not on file   Social Determinants of Health   Financial Resource Strain: Not on file  Food Insecurity: Not on file  Transportation Needs: Not on file  Physical Activity: Not on file  Stress: Not on file  Social Connections: Not on file  Intimate Partner Violence: Not on file    Family History  Problem Relation Age of Onset   Stroke Mother    Hypertension Mother    Diabetes Mother    Depression Mother    Colon polyps Father    Hypertension Father    Stroke Father    Diabetes Father    Colon cancer Neg Hx    Esophageal cancer Neg Hx    Rectal cancer Neg Hx    Stomach cancer Neg Hx     Past Surgical History:  Procedure Laterality Date   ABDOMINAL HYSTERECTOMY     CARPAL TUNNEL RELEASE Left    COLONOSCOPY     20 + years ago   LAPAROSCOPIC GASTRIC SLEEVE RESECTION N/A 04/07/2020   Procedure: LAPAROSCOPIC GASTRIC SLEEVE RESECTION;  Surgeon: Luretha Murphy, MD;  Location: [facility];  Service: General;  Laterality: N/A;   partial hsyterectomy     TUBAL LIGATION  2014   UPPER GASTROINTESTINAL  ENDOSCOPY     UPPER GI ENDOSCOPY N/A 04/07/2020   Procedure: UPPER GI ENDOSCOPY;  Surgeon: Luretha Murphy, MD;  Location: [facility];  Service: General;  Laterality: N/A;    ROS: Review of Systems Negative except as stated above  PHYSICAL EXAM: BP 119/84   Pulse 87   Temp (!) 97.5 F (36.4 C) (Oral)   Ht 5\' 4"  (1.626 m)   Wt 275 lb 3.2 oz (124.8 kg)   SpO2 95%   BMI 47.24 kg/m   Wt  Readings from Last 3 Encounters:  09/24/21 275 lb 3.2 oz (124.8 kg)  03/18/21 256 lb (116.1 kg)  03/06/21 254 lb 6.4 oz (115.4 kg)    Physical Exam  General appearance - alert, well appearing, and in no distress Mental status - normal mood, behavior, speech, dress, motor activity, and thought processes Neck - supple, no significant adenopathy Chest - clear to auscultation, no wheezes, rales or rhonchi, symmetric air entry Heart - normal rate, regular rhythm, normal S1, S2, no murmurs, rubs, clicks or gallops Neurological - cranial nerves II through XII intact, motor and sensory grossly normal bilaterally.  Gait is stable. Speech normal and fluent Extremities - peripheral pulses normal, no pedal edema, no clubbing or cyanosis      Latest Ref Rng & Units 02/24/2021   11:41 PM 04/07/2020   11:00 AM 03/31/2020    8:37 AM  CMP  Glucose 70 - 99 mg/dL 92   027   BUN 6 - 20 mg/dL 8   13   Creatinine 2.53 - 1.00 mg/dL 6.64  4.03  4.74   Sodium 135 - 145 mmol/L 139   142   Potassium 3.5 - 5.1 mmol/L 3.6   4.0   Chloride 98 - 111 mmol/L 106   105   CO2 22 - 32 mmol/L 26   24   Calcium 8.9 - 10.3 mg/dL 8.6   9.4   Total Protein 6.5 - 8.1 g/dL   7.8   Total Bilirubin 0.3 - 1.2 mg/dL   0.6   Alkaline Phos 38 - 126 U/L   70   AST 15 - 41 U/L   33   ALT 0 - 44 U/L   48     CBC    Component Value Date/Time   WBC 4.4 02/24/2021 2341   RBC 5.31 (H) 02/24/2021 2341   HGB 13.3 02/24/2021 2341   HCT 40.5 02/24/2021 2341   PLT 238 02/24/2021 2341   MCV 76.3 (L) 02/24/2021 2341   MCH 25.0 (L) 02/24/2021  2341   MCHC 32.8 02/24/2021 2341   RDW 16.7 (H) 02/24/2021 2341   LYMPHSABS 1.3 04/08/2020 0512   MONOABS 0.7 04/08/2020 0512   EOSABS 0.0 04/08/2020 0512   BASOSABS 0.0 04/08/2020 0512    ASSESSMENT AND PLAN:  1. Essential hypertension Close to goal. Blood pressure is being checked twice a week at the plasma center where she donates and she reports good readings there as well.  Continue amlodipine 10 mg daily. - CBC - Comprehensive metabolic panel - amLODipine (NORVASC) 10 MG tablet; Take 1 tablet (10 mg total) by mouth daily.  Dispense: 90 tablet; Refill: 1  2. Word finding problem We will refer to neurology for further evaluation and management.  However speech today seems fine and fluent.  3. Obesity, Class III, BMI 40-49.9 (morbid obesity) (HCC) Commended her on trying to get back on track with her eating habits.  She should continue to improve on meal planning. Continue what she has started of walking 2x/day for 10 minutes during lunch breaks - Hemoglobin A1c  4. Mixed hyperlipidemia Continue Pravachol - Lipid panel - pravastatin (PRAVACHOL) 10 MG tablet; Take 1 tablet (10 mg total) by mouth at bedtime.  Dispense: 90 tablet; Refill: 1  5. Anxiety and depression Keep appt with [facility] Advised that she mentions the issues with mental cloudiness/concentration that she has been experiencing as it could be indicative of attention deficit.  6. Screening for HIV (human immunodeficiency virus) - HIV  antibody (with reflex)  7. Influenza vaccination declined   8. Tetanus, diphtheria, and acellular pertussis (Tdap) vaccination declined   Addendum 09/25/2021: Hemoglobin normal but patient with microcytosis.  We will add iron studies.  Patient was given the opportunity to ask questions.  Patient verbalized understanding of the plan and was able to repeat key elements of the plan.   This documentation was completed using Paediatric nurse.  Any transcriptional  errors are unintentional.  Orders Placed This Encounter  Procedures   CBC   Comprehensive metabolic panel   Lipid panel   Hemoglobin A1c   HIV antibody (with reflex)     Requested Prescriptions   Signed Prescriptions Disp Refills   amLODipine (NORVASC) 10 MG tablet 90 tablet 1    Sig: Take 1 tablet (10 mg total) by mouth daily.   pravastatin (PRAVACHOL) 10 MG tablet 90 tablet 1    Sig: Take 1 tablet (10 mg total) by mouth at bedtime.    Return in about 4 months (around 01/24/2022).  Jonah Blue, MD, FACP

## 2021-09-24 NOTE — Progress Notes (Signed)
Pt states that she would like to discuss the difficultly speaking. She knows what to say but caont form the words to make the sentence.

## 2021-09-25 ENCOUNTER — Encounter: Payer: Self-pay | Admitting: Neurology

## 2021-09-25 LAB — COMPREHENSIVE METABOLIC PANEL
ALT: 25 IU/L (ref 0–32)
AST: 17 IU/L (ref 0–40)
Albumin/Globulin Ratio: 1.8 (ref 1.2–2.2)
Albumin: 3.9 g/dL (ref 3.9–4.9)
Alkaline Phosphatase: 109 IU/L (ref 44–121)
BUN/Creatinine Ratio: 14 (ref 9–23)
BUN: 10 mg/dL (ref 6–24)
Bilirubin Total: <0.2 mg/dL (ref 0.0–1.2)
CO2: 26 mmol/L (ref 20–29)
Calcium: 9.4 mg/dL (ref 8.7–10.2)
Chloride: 104 mmol/L (ref 96–106)
Creatinine, Ser: 0.73 mg/dL (ref 0.57–1.00)
Globulin, Total: 2.2 g/dL (ref 1.5–4.5)
Glucose: 96 mg/dL (ref 70–99)
Potassium: 4.6 mmol/L (ref 3.5–5.2)
Sodium: 144 mmol/L (ref 134–144)
Total Protein: 6.1 g/dL (ref 6.0–8.5)
eGFR: 100 mL/min/{1.73_m2} (ref >59)

## 2021-09-25 LAB — LIPID PANEL
Chol/HDL Ratio: 3.8 ratio (ref 0.0–4.4)
Cholesterol, Total: 187 mg/dL (ref 100–199)
HDL: 49 mg/dL (ref >39)
LDL Chol Calc (NIH): 107 mg/dL — ABNORMAL HIGH (ref 0–99)
Triglycerides: 178 mg/dL — ABNORMAL HIGH (ref 0–149)
VLDL Cholesterol Cal: 31 mg/dL (ref 5–40)

## 2021-09-25 LAB — CBC
Hematocrit: 47.3 % — ABNORMAL HIGH (ref 34.0–46.6)
Hemoglobin: 15.6 g/dL (ref 11.1–15.9)
MCH: 25.4 pg — ABNORMAL LOW (ref 26.6–33.0)
MCHC: 33 g/dL (ref 31.5–35.7)
MCV: 77 fL — ABNORMAL LOW (ref 79–97)
Platelets: 255 10*3/uL (ref 150–450)
RBC: 6.13 x10E6/uL — ABNORMAL HIGH (ref 3.77–5.28)
RDW: 17.4 % — ABNORMAL HIGH (ref 11.7–15.4)
WBC: 5.2 10*3/uL (ref 3.4–10.8)

## 2021-09-25 LAB — HIV ANTIBODY (ROUTINE TESTING W REFLEX): HIV Screen 4th Generation wRfx: NONREACTIVE

## 2021-09-25 LAB — HEMOGLOBIN A1C
Est. average glucose Bld gHb Est-mCnc: 123 mg/dL
Hgb A1c MFr Bld: 5.9 % — ABNORMAL HIGH (ref 4.8–5.6)

## 2021-09-25 NOTE — Addendum Note (Signed)
Addended by: Jonah Blue B on: 09/25/2021 09:31 PM   Modules accepted: Orders
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
## 2021-10-01 ENCOUNTER — Encounter: Payer: Self-pay | Admitting: Psychiatry

## 2021-10-01 ENCOUNTER — Encounter: Payer: Self-pay | Admitting: Internal Medicine

## 2021-10-01 ENCOUNTER — Other Ambulatory Visit
Admission: RE | Admit: 2021-10-01 | Discharge: 2021-10-01 | Disposition: A | Discharge status: Home / Self Care | Payer: Managed Care, Other (non HMO) | Attending: Psychiatry | Admitting: Psychiatry

## 2021-10-01 ENCOUNTER — Other Ambulatory Visit: Payer: Self-pay | Admitting: Internal Medicine

## 2021-10-01 ENCOUNTER — Ambulatory Visit (INDEPENDENT_AMBULATORY_CARE_PROVIDER_SITE_OTHER): Payer: 59 | Admitting: Psychiatry

## 2021-10-01 DIAGNOSIS — F331 Major depressive disorder, recurrent, moderate: Secondary | ICD-10-CM | POA: Diagnosis present

## 2021-10-01 DIAGNOSIS — G4700 Insomnia, unspecified: Secondary | ICD-10-CM

## 2021-10-01 DIAGNOSIS — E611 Iron deficiency: Secondary | ICD-10-CM

## 2021-10-01 LAB — SPECIMEN STATUS REPORT

## 2021-10-01 LAB — TSH: TSH: 1.345 u[IU]/mL (ref 0.350–4.500)

## 2021-10-01 LAB — IRON,TIBC AND FERRITIN PANEL
Ferritin: 42 ng/mL (ref 15–150)
Iron Saturation: 16 % (ref 15–55)
Iron: 54 ug/dL (ref 27–159)
Total Iron Binding Capacity: 334 ug/dL (ref 250–450)
UIBC: 280 ug/dL (ref 131–425)

## 2021-10-01 NOTE — Patient Instructions (Signed)
Continue lexapro 20 mg daily  Start bupropion 150 mg daily  Start Ambien 5 mg night  Obtain lab (TSH) Next appointment 10/26 at 4:30, in person

## 2021-10-02 ENCOUNTER — Telehealth: Payer: Self-pay | Admitting: Hematology

## 2021-10-02 NOTE — Telephone Encounter (Signed)
Scheduled appt per 9/7 referral. Pt is aware of appt date and time. Pt is aware to arrive 15 mins prior to appt time and to bring and updated insurance card. Pt is aware of appt location.   

## 2021-10-07 ENCOUNTER — Telehealth: Payer: Self-pay | Admitting: *Deleted

## 2021-10-07 ENCOUNTER — Other Ambulatory Visit: Payer: Self-pay | Admitting: Psychiatry

## 2021-10-07 MED ORDER — BUPROPION HCL ER (XL) 150 MG PO TB24: 150.0000 mg | ORAL_TABLET | Freq: Every day | ORAL | 1 refills | Status: DC

## 2021-10-07 MED ORDER — ZOLPIDEM TARTRATE 5 MG PO TABS: 5.0000 mg | ORAL_TABLET | Freq: Every evening | ORAL | 1 refills | Status: DC | PRN

## 2021-10-07 NOTE — Telephone Encounter (Signed)
Patient called "Stated that the 2 new medications that she was start after her 10/01/21 APPT. Both of  her 2 Rx's say they haven't received scripts.  Patient requested scripts be resent

## 2021-10-07 NOTE — Telephone Encounter (Signed)
Sorry, it seems like the order was not finalized. I ordered bupropion and Ambien.

## 2021-10-15 ENCOUNTER — Other Ambulatory Visit: Payer: Self-pay

## 2021-10-16 ENCOUNTER — Telehealth: Payer: Self-pay | Admitting: Hematology and Oncology

## 2021-10-16 ENCOUNTER — Inpatient Hospital Stay: Payer: Managed Care, Other (non HMO) | Attending: Hematology | Admitting: Hematology

## 2021-10-16 NOTE — Telephone Encounter (Signed)
R/s pt's new hem appt per pt request. Appt was marked as no show due to pt calling with less than 24 hr notice . Pt is aware of new appt date/time.

## 2021-10-30 ENCOUNTER — Encounter (HOSPITAL_COMMUNITY): Payer: Self-pay | Admitting: *Deleted

## 2021-11-13 ENCOUNTER — Telehealth: Payer: Self-pay

## 2021-11-13 ENCOUNTER — Inpatient Hospital Stay: Payer: Managed Care, Other (non HMO)

## 2021-11-13 ENCOUNTER — Inpatient Hospital Stay: Payer: Managed Care, Other (non HMO) | Attending: Hematology | Admitting: Hematology and Oncology

## 2021-11-13 VITALS — BP 118/83 | HR 86 | Temp 98.6°F | Resp 15 | Wt 276.3 lb

## 2021-11-13 DIAGNOSIS — D573 Sickle-cell trait: Secondary | ICD-10-CM | POA: Diagnosis present

## 2021-11-13 DIAGNOSIS — R718 Other abnormality of red blood cells: Secondary | ICD-10-CM

## 2021-11-13 DIAGNOSIS — Z9884 Bariatric surgery status: Secondary | ICD-10-CM | POA: Diagnosis not present

## 2021-11-13 LAB — CBC WITH DIFFERENTIAL (CANCER CENTER ONLY)
Abs Immature Granulocytes: 0.01 10*3/uL (ref 0.00–0.07)
Basophils Absolute: 0.1 10*3/uL (ref 0.0–0.1)
Basophils Relative: 1 %
Eosinophils Absolute: 0.1 10*3/uL (ref 0.0–0.5)
Eosinophils Relative: 2 %
HCT: 41.8 % (ref 36.0–46.0)
Hemoglobin: 14.1 g/dL (ref 12.0–15.0)
Immature Granulocytes: 0 %
Lymphocytes Relative: 32 %
Lymphs Abs: 1.8 10*3/uL (ref 0.7–4.0)
MCH: 25.5 pg — ABNORMAL LOW (ref 26.0–34.0)
MCHC: 33.7 g/dL (ref 30.0–36.0)
MCV: 75.6 fL — ABNORMAL LOW (ref 80.0–100.0)
Monocytes Absolute: 0.5 10*3/uL (ref 0.1–1.0)
Monocytes Relative: 8 %
Neutro Abs: 3.2 10*3/uL (ref 1.7–7.7)
Neutrophils Relative %: 57 %
Platelet Count: 240 10*3/uL (ref 150–400)
RBC: 5.53 MIL/uL — ABNORMAL HIGH (ref 3.87–5.11)
RDW: 15.2 % (ref 11.5–15.5)
WBC Count: 5.7 10*3/uL (ref 4.0–10.5)
nRBC: 0 % (ref 0.0–0.2)

## 2021-11-13 LAB — CMP (CANCER CENTER ONLY)
ALT: 22 U/L (ref 0–44)
AST: 19 U/L (ref 15–41)
Albumin: 3.8 g/dL (ref 3.5–5.0)
Alkaline Phosphatase: 69 U/L (ref 38–126)
Anion gap: 4 — ABNORMAL LOW (ref 5–15)
BUN: 6 mg/dL (ref 6–20)
CO2: 31 mmol/L (ref 22–32)
Calcium: 8.7 mg/dL — ABNORMAL LOW (ref 8.9–10.3)
Chloride: 108 mmol/L (ref 98–111)
Creatinine: 1.03 mg/dL — ABNORMAL HIGH (ref 0.44–1.00)
GFR, Estimated: >60 mL/min (ref >60)
Glucose, Bld: 94 mg/dL (ref 70–99)
Potassium: 3.9 mmol/L (ref 3.5–5.1)
Sodium: 143 mmol/L (ref 135–145)
Total Bilirubin: 0.4 mg/dL (ref 0.3–1.2)
Total Protein: 6.1 g/dL — ABNORMAL LOW (ref 6.5–8.1)

## 2021-11-13 LAB — VITAMIN B12: Vitamin B-12: 1200 pg/mL — ABNORMAL HIGH (ref 180–914)

## 2021-11-13 NOTE — Telephone Encounter (Signed)
pt left a message that she has been on wellbutrtin for some time now but yesterday she went to a concert and she had feeling like she wanted to jump off balcony.  she hasn't felt like that before. she very concerned and she wants to speak with you about maybe adding something or changing med.

## 2021-11-13 NOTE — Telephone Encounter (Signed)
Talked with the patient. She states that she was at the concert, and was on the second floor.  She felt she wanted to jump off to end her life.  The more she danced, the more she had this thought.  She felt very scary, and could not enjoy dancing anymore due to this.  She adamantly denies any SI, and she denies any SI thoughts since last night.  She had 1 drink.  She thought her mood has been getting better after starting bupropion.  She agrees with the following.  - discontinue bupropion  - keep the appointment next week Emergency resources which includes 911, ED, suicide crisis line [phone]) are discussed if any worsening in SI.

## 2021-11-13 NOTE — Progress Notes (Signed)
[facility] [place] [phone]   Fax:([phone] [place] NOTE  Patient Care Team: Ladell Pier, MD as PCP - General (Internal Medicine)  Hematological/Oncological History # Microcytosis # Sickle Cell Trait  # Gastric Sleeve Surgery 09/24/2021: WBC 5.2, Hgb 15.6, MCV 77, Plt 255 11/13/2021: establish care with Dr. Lorenso Courier. WBC 5.7, Hgb 14.1, MCV 75.6, Plt 240  CHIEF COMPLAINTS/PURPOSE OF CONSULTATION:  "Microcytosis "  HISTORY OF PRESENTING ILLNESS:  Pamela Simon 51 y.o. female with medical history significant for depression, hyperlipidemia, hypertension, and pre-type 2 diabetes who presents for evaluation of microcytosis with concern for iron deficiency.  On review of the previous records Pamela Simon had labs drawn on 09/24/2021 which showed white blood cell count 5.2, hemoglobin 15.6, MCV 77, and platelets of 255.  Iron studies showed total iron-binding capacity 334, ferritin 42, iron saturation 16%.  Due to concern for microcytosis the patient was referred to hematology for further evaluation and management.  On exam today Pamela Simon reports that she feels quite well.  She reports her energy is currently about 7-10.  She does not have any issues with shortness of breath is able to complete her day-to-day activities without any difficulty.  She reports that she did have a gastric sleeve approximately 1 year ago but unfortunately has not been eating well has not been losing much in the way of weight.  She reports that she does not eat a lot of red meat or pork.  She did undergo a hysterectomy in 2019 and has no other signs or symptoms concerning for bleeding.  On further discussion she reports that her father had sickle cell trait as does her daughter.  Her father had prostate cancer and her mother died due to a brain tumor.  She is a never smoker and very rarely drinks alcohol, particularly after the sleep.  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  She currently denies any fevers, chills, sweats, nausea, vomiting or diarrhea.  A full 10 point ROS is listed below.  MEDICAL HISTORY:  Past Medical History:  Diagnosis Date   Allergy    seasonal   Arthritis    knees   Depression    Hyperlipidemia    Hypertension    Pre-diabetes     SURGICAL HISTORY: Past Surgical History:  Procedure Laterality Date   ABDOMINAL HYSTERECTOMY     CARPAL TUNNEL RELEASE Left    COLONOSCOPY     20 + years ago   [place] N/A 04/07/2020   Procedure: LAPAROSCOPIC GASTRIC SLEEVE RESECTION;  Surgeon: Johnathan Hausen, MD;  Location: [facility];  Service: General;  Laterality: N/A;   partial hsyterectomy     TUBAL LIGATION  2014   UPPER GASTROINTESTINAL ENDOSCOPY     UPPER GI ENDOSCOPY N/A 04/07/2020   Procedure: UPPER GI ENDOSCOPY;  Surgeon: Johnathan Hausen, MD;  Location: [facility];  Service: General;  Laterality: N/A;    SOCIAL HISTORY: Social History   Socioeconomic History   Marital status: Divorced    Spouse name: Not on file   Number of children: 1   Years of education: Not on file   Highest education level: Bachelor's degree (e.g., BA, AB, BS)  Occupational History   Not on file  Tobacco Use   Smoking status: Never    Passive exposure: Never   Smokeless tobacco: Never  Vaping Use   Vaping Use: Never used  Substance and Sexual Activity  Alcohol use: Not Currently   Drug use: Yes    Types: Marijuana    Comment: twice a month   Sexual activity: Not Currently  Other Topics Concern   Not on file  Social History Narrative   Not on file   Social Determinants of Health   Financial Resource Strain: Not on file  Food Insecurity: Not on file  Transportation Needs: Not on file  Physical Activity: Not on file  Stress: Not on file  Social Connections: Not on file  Intimate Partner Violence: Not on file    FAMILY HISTORY: Family History  Problem Relation Age of Onset    Stroke Mother    Hypertension Mother    Diabetes Mother    Depression Mother    Colon polyps Father    Hypertension Father    Stroke Father    Diabetes Father    Colon cancer Neg Hx    Esophageal cancer Neg Hx    Rectal cancer Neg Hx    Stomach cancer Neg Hx     ALLERGIES:  has No Known Allergies.  MEDICATIONS:  Current Outpatient Medications  Medication Sig Dispense Refill   amLODipine (NORVASC) 10 MG tablet Take 1 tablet (10 mg total) by mouth daily. 90 tablet 1   aspirin EC 81 MG tablet Take 1 tablet (81 mg total) by mouth daily. Swallow whole. 90 tablet 3   BIOTIN PO Take 2,000 mg by mouth in the morning.     buPROPion (WELLBUTRIN XL) 150 MG 24 hr tablet Take 1 tablet (150 mg total) by mouth daily. 30 tablet 1   calcium carbonate (TUMS - DOSED IN MG ELEMENTAL CALCIUM) 500 MG chewable tablet Chew 1 tablet by mouth 3 (three) times daily.     cholecalciferol (VITAMIN D) 25 MCG (1000 UNIT) tablet Take 3 tablets by mouth daily.     clindamycin (CLEOCIN T) 1 % lotion Apply topically.     diphenhydramine-acetaminophen (TYLENOL PM EXTRA STRENGTH) 25-500 MG TABS tablet Take 2 tablets by mouth at bedtime as needed (sleep).     escitalopram (LEXAPRO) 20 MG tablet TAKE 1 TABLET DAILY 90 tablet 0   fluticasone (FLONASE) 50 MCG/ACT nasal spray Place 2 sprays into both nostrils at bedtime as needed for allergies. 15.8 mL 4   Multiple Vitamin (MULTI-VITAMIN) tablet Take 1 tablet by mouth daily.     Multiple Vitamins-Minerals (BARIATRIC MULTIVITAMINS/IRON) CAPS Take 1 capsule by mouth daily.     mupirocin ointment (BACTROBAN) 2 % Apply 1 application topically as needed (wound).     pravastatin (PRAVACHOL) 10 MG tablet Take 1 tablet (10 mg total) by mouth at bedtime. 90 tablet 1   vitamin C (ASCORBIC ACID) 500 MG tablet Take 500 mg by mouth daily.     zolpidem (AMBIEN) 5 MG tablet Take 1 tablet (5 mg total) by mouth at bedtime as needed for sleep. 30 tablet 1   No current facility-administered  medications for this visit.    REVIEW OF SYSTEMS:   Constitutional: ( - ) fevers, ( - )  chills , ( - ) night sweats Eyes: ( - ) blurriness of vision, ( - ) double vision, ( - ) watery eyes Ears, nose, mouth, throat, and face: ( - ) mucositis, ( - ) sore throat Respiratory: ( - ) cough, ( - ) dyspnea, ( - ) wheezes Cardiovascular: ( - ) palpitation, ( - ) chest discomfort, ( - ) lower extremity swelling Gastrointestinal:  ( - ) nausea, ( - )  heartburn, ( - ) change in bowel habits Skin: ( - ) abnormal skin rashes Lymphatics: ( - ) new lymphadenopathy, ( - ) easy bruising Neurological: ( - ) numbness, ( - ) tingling, ( - ) new weaknesses Behavioral/Psych: ( - ) mood change, ( - ) new changes  All other systems were reviewed with the patient and are negative.  PHYSICAL EXAMINATION:  Vitals:   11/13/21 0917  BP: 118/83  Pulse: 86  Resp: 15  Temp: 98.6 F (37 C)  SpO2: 97%   Filed Weights   11/13/21 0917  Weight: 276 lb 4.8 oz (125.3 kg)    GENERAL: well appearing middle-aged African-American female in NAD  SKIN: skin color, texture, turgor are normal, no rashes or significant lesions EYES: conjunctiva are pink and non-injected, sclera clear LUNGS: clear to auscultation and percussion with normal breathing effort HEART: regular rate & rhythm and no murmurs and no lower extremity edema Musculoskeletal: no cyanosis of digits and no clubbing  PSYCH: alert & oriented x 3, fluent speech NEURO: no focal motor/sensory deficits  LABORATORY DATA:  I have reviewed the data as listed    Latest Ref Rng & Units 11/13/2021    9:52 AM 09/24/2021    4:23 PM 02/24/2021   11:41 PM  CBC  WBC 4.0 - 10.5 K/uL 5.7  5.2  4.4   Hemoglobin 12.0 - 15.0 g/dL 46.9  62.9  52.8   Hematocrit 36.0 - 46.0 % 41.8  47.3  40.5   Platelets 150 - 400 K/uL 240  255  238        Latest Ref Rng & Units 11/13/2021    9:52 AM 09/24/2021    4:23 PM 02/24/2021   11:41 PM  CMP  Glucose 70 - 99 mg/dL 94  96  92    BUN 6 - 20 mg/dL 6  10  8    Creatinine 0.44 - 1.00 mg/dL  4.13  2.44   Sodium 135 - 145 mmol/L 143  144  139   Potassium 3.5 - 5.1 mmol/L 3.9  4.6  3.6   Chloride 98 - 111 mmol/L 108  104  106   CO2 22 - 32 mmol/L 31  26  26    Calcium 8.9 - 10.3 mg/dL 8.7  9.4  8.6   Total Protein 6.5 - 8.1 g/dL 6.1  6.1    Total Bilirubin 0.3 - 1.2 mg/dL 0.4  0.10    Alkaline Phos 38 - 126 U/L 69  109    AST 15 - 41 U/L 19  17    ALT 0 - 44 U/L 22  25       ASSESSMENT & PLAN 51 y.o. female with medical history significant for depression, hyperlipidemia, hypertension, and pre-type 2 diabetes who presents for evaluation of microcytosis with concern for iron deficiency.  After review of the labs, review of the records, and discussion with the patient the patients findings are most consistent with microcytosis secondary to sickle cell trait.  The patient has had a gastric sleeve surgery which may cause nutritional deficiencies in the future.  At this time it appears her nutritional levels are within normal limits.  There is no clear indication right now for iron supplementation.  If she were to require this would recommend IV iron supplementation.  The patient voiced understanding of the plan moving forward and noted that she would be willing to follow with her primary care provider with the referral in the  event she would develop any new or worsening cytopenias.  # Microcytosis # Sickle Cell Trait  # Gastric Sleeve Surgery -- Etiology of the microcytosis is most likely her sickle cell trait.  Her iron levels are borderline, but she does not currently have any menstrual cycles and is asymptomatic. -- No indication for iron supplementation at this time.  If the patient were to develop iron deficiency she would require IV therapy as she had a gastric sleeve procedure. -- We will check vitamin B12 levels and methylmalonic acid per patient request today.  This may also become deficient in  time after her procedure -- No need for follow-up in our clinic on a routine basis.  If she were to develop any new or worsening cytopenias we will be happy to see her back in our clinic.  Provided her with our number today.  Orders Placed This Encounter  Procedures   CBC with Differential (Cancer Center Only)    Standing Status:   Future    Number of Occurrences:   1    Standing Expiration Date:   11/14/2022   CMP (Cancer Center only)    Standing Status:   Future    Number of Occurrences:   1    Standing Expiration Date:   11/14/2022   Vitamin B12    Standing Status:   Future    Number of Occurrences:   1    Standing Expiration Date:   11/13/2022   Methylmalonic acid, serum    Standing Status:   Future    Number of Occurrences:   1    Standing Expiration Date:   11/13/2022    All questions were answered. The patient knows to call the clinic with any problems, questions or concerns.  A total of more than 45 minutes were spent on this encounter with face-to-face time and non-face-to-face time, including preparing to see the patient, ordering tests and/or medications, counseling the patient and coordination of care as outlined above.   Ulysees Barns, MD Department of Hematology/Oncology [facility] Phone: [phone] Pager: [phone] Email: [email or web address]  11/13/2021 11:48 AM

## 2021-11-16 LAB — METHYLMALONIC ACID, SERUM: Methylmalonic Acid, Quantitative: 105 nmol/L (ref 0–378)

## 2021-11-17 NOTE — Progress Notes (Unsigned)
Virtual Visit via Video Note  I connected with Pamela Simon on 11/19/21 at  4:30 PM EDT by a video enabled telemedicine application and verified that I am speaking with the correct person using two identifiers.  Location: Patient: car Provider: office Persons participated in the visit- patient, provider    I discussed the limitations of evaluation and management by telemedicine and the availability of in person appointments. The patient expressed understanding and agreed to proceed.    I discussed the assessment and treatment plan with the patient. The patient was provided an opportunity to ask questions and all were answered. The patient agreed with the plan and demonstrated an understanding of the instructions.   The patient was advised to call back or seek an in-person evaluation if the symptoms worsen or if the condition fails to improve as anticipated.  I provided 13 minutes of non-face-to-face time during this encounter.   Norman Clay, MD    [facility] MD/PA/NP OP Progress Note  11/19/2021 5:04 PM DALIAH CHAUDOIN  MRN:  [id number]  Chief Complaint:  Chief Complaint  Patient presents with   Follow-up   Depression   HPI:  This is a follow-up appointment for depression.  She states that she has been doing good since talking on the phone after having impulsive SI last week.  She had not had any SI since then.  She also feels calmer, and does not have fuzziness as she used to have.  She is in the process of searching housing [place].  She feels excited, although she feels stressed.  She denies any issues at work .  She enjoyed going to 2 concerts instead of sleeping in the bed. Although she feels anxious driving to [facility], she has not used any CBD since the last visit.  She denies feeling depressed.  She denies change in appetite.  She denies SI, HI, hallucinations.  She denies alcohol use or drug use.  She continues to struggle with middle insomnia, although it has improved  since starting Ambien.  She will ask to try higher dose at this time.   Daily routine: Diet:  Exercise: Support: (none) Household: by herself Marital status: Single. divorced twice  Number of children:26. 35 year old daughter Employment: Education officer, museum, case management. Used to work for [employer] Education:   Last PCP / ongoing medical evaluation:    273 lbs Wt Readings from Last 3 Encounters:  11/13/21 276 lb 4.8 oz (125.3 kg)  09/24/21 275 lb 3.2 oz (124.8 kg)  03/18/21 256 lb (116.1 kg)       Visit Diagnosis:    ICD-10-CM   1. MDD (major depressive disorder), recurrent, in partial remission ([place])  F33.41     2. Insomnia, unspecified type  G47.00       Past Psychiatric History: Please see initial evaluation for full details. I have reviewed the history. No updates at this time.     Past Medical History:  Past Medical History:  Diagnosis Date   Allergy    seasonal   Arthritis    knees   Depression    Hyperlipidemia    Hypertension    Pre-diabetes     Past Surgical History:  Procedure Laterality Date   ABDOMINAL HYSTERECTOMY     CARPAL TUNNEL RELEASE Left    COLONOSCOPY     20 + years ago   [place] RESECTION N/A 04/07/2020   Procedure: LAPAROSCOPIC GASTRIC SLEEVE RESECTION;  Surgeon: Johnathan Hausen, MD;  Location: [facility];  Service: General;  Laterality: N/A;   partial hsyterectomy     TUBAL LIGATION  2014   UPPER GASTROINTESTINAL ENDOSCOPY     UPPER GI ENDOSCOPY N/A 04/07/2020   Procedure: UPPER GI ENDOSCOPY;  Surgeon: Luretha Murphy, MD;  Location: [facility];  Service: General;  Laterality: N/A;    Family Psychiatric History: Please see initial evaluation for full details. I have reviewed the history. No updates at this time.     Family History:  Family History  Problem Relation Age of Onset   Stroke Mother    Hypertension Mother    Diabetes Mother    Depression Mother    Colon polyps Father    Hypertension Father    Stroke Father     Diabetes Father    Colon cancer Neg Hx    Esophageal cancer Neg Hx    Rectal cancer Neg Hx    Stomach cancer Neg Hx     Social History:  Social History   Socioeconomic History   Marital status: Divorced    Spouse name: Not on file   Number of children: 1   Years of education: Not on file   Highest education level: Bachelor's degree (e.g., BA, AB, BS)  Occupational History   Not on file  Tobacco Use   Smoking status: Never    Passive exposure: Never   Smokeless tobacco: Never  Vaping Use   Vaping Use: Never used  Substance and Sexual Activity   Alcohol use: Not Currently   Drug use: Yes    Types: Marijuana    Comment: twice a month   Sexual activity: Not Currently  Other Topics Concern   Not on file  Social History Narrative   Not on file   Social Determinants of Health   Financial Resource Strain: Not on file  Food Insecurity: Not on file  Transportation Needs: Not on file  Physical Activity: Not on file  Stress: Not on file  Social Connections: Not on file    Allergies: No Known Allergies  Metabolic Disorder Labs: Lab Results  Component Value Date   HGBA1C 5.9 (H) 09/24/2021   MPG 131.24 03/31/2020   No results found for: "PROLACTIN" Lab Results  Component Value Date   CHOL 187 09/24/2021   TRIG 178 (H) 09/24/2021   HDL 49 09/24/2021   CHOLHDL 3.8 09/24/2021   LDLCALC 107 (H) 09/24/2021   Lab Results  Component Value Date   TSH 1.345 10/01/2021    Therapeutic Level Labs: No results found for: "LITHIUM" No results found for: "VALPROATE" No results found for: "CBMZ"  Current Medications: Current Outpatient Medications  Medication Sig Dispense Refill   [START ON 11/23/2021] zolpidem (AMBIEN) 10 MG tablet Take 1 tablet (10 mg total) by mouth at bedtime as needed for sleep. 30 tablet 0   amLODipine (NORVASC) 10 MG tablet Take 1 tablet (10 mg total) by mouth daily. 90 tablet 1   aspirin EC 81 MG tablet Take 1 tablet (81 mg total) by mouth daily.  Swallow whole. 90 tablet 3   BIOTIN PO Take 2,000 mg by mouth in the morning.     calcium carbonate (TUMS - DOSED IN MG ELEMENTAL CALCIUM) 500 MG chewable tablet Chew 1 tablet by mouth 3 (three) times daily.     cholecalciferol (VITAMIN D) 25 MCG (1000 UNIT) tablet Take 3 tablets by mouth daily.     clindamycin (CLEOCIN T) 1 % lotion Apply topically.     diphenhydramine-acetaminophen (TYLENOL PM EXTRA STRENGTH) 25-500  MG TABS tablet Take 2 tablets by mouth at bedtime as needed (sleep).     escitalopram (LEXAPRO) 20 MG tablet TAKE 1 TABLET DAILY 90 tablet 0   fluticasone (FLONASE) 50 MCG/ACT nasal spray Place 2 sprays into both nostrils at bedtime as needed for allergies. 15.8 mL 4   Multiple Vitamin (MULTI-VITAMIN) tablet Take 1 tablet by mouth daily.     Multiple Vitamins-Minerals (BARIATRIC MULTIVITAMINS/IRON) CAPS Take 1 capsule by mouth daily.     mupirocin ointment (BACTROBAN) 2 % Apply 1 application topically as needed (wound).     pravastatin (PRAVACHOL) 10 MG tablet Take 1 tablet (10 mg total) by mouth at bedtime. 90 tablet 1   vitamin C (ASCORBIC ACID) 500 MG tablet Take 500 mg by mouth daily.     zolpidem (AMBIEN) 5 MG tablet Take 1 tablet (5 mg total) by mouth at bedtime as needed for sleep. 30 tablet 1   No current facility-administered medications for this visit.     Musculoskeletal: Strength & Muscle Tone:  N/A Gait & Station:  N/A Patient leans: N/A  Psychiatric Specialty Exam: Review of Systems  Psychiatric/Behavioral:  Positive for sleep disturbance. Negative for agitation, behavioral problems, confusion, decreased concentration, dysphoric mood, hallucinations, self-injury and suicidal ideas. The patient is nervous/anxious. The patient is not hyperactive.   All other systems reviewed and are negative.   There were no vitals taken for this visit.There is no height or weight on file to calculate BMI.  General Appearance: Fairly Groomed  Eye Contact:  Good  Speech:   Clear and Coherent  Volume:  Normal  Mood:   good  Affect:  Appropriate, Congruent, and calm  Thought Process:  Coherent  Orientation:  Full (Time, Place, and Person)  Thought Content: Logical   Suicidal Thoughts:  No  Homicidal Thoughts:  No  Memory:  Immediate;   Good  Judgement:  Good  Insight:  Good  Psychomotor Activity:  Normal  Concentration:  Concentration: Good and Attention Span: Good  Recall:  Good  Fund of Knowledge: Good  Language: Good  Akathisia:  No  Handed:  Right  AIMS (if indicated): not done  Assets:  Communication Skills Desire for Improvement  ADL's:  Intact  Cognition: WNL  Sleep:  Fair   Screenings: GAD-7    Flowsheet Row Office Visit from 10/01/2021 in [place] Office Visit from 09/24/2021 in [place] Office Visit from 03/06/2021 in [place] Office Visit from 08/21/2020 in [place]  Total GAD-7 Score 4 2 0 0      PHQ2-9    [place] Office Visit from 10/01/2021 in [place] Office Visit from 09/24/2021 in [place] Office Visit from 03/06/2021 in [place] Office Visit from 08/21/2020 in [place] from [facility]  PHQ-2 Total Score 2 2 0 0 0  PHQ-9 Total Score 11 6 -- -- --      [place] Office Visit from 10/01/2021 in [place] ED from 02/24/2021 in [place] DEPT Admission (Discharged) from 04/07/2020 in [facility]  C-SSRS RISK CATEGORY No Risk No Risk No Risk        Assessment and Plan:  DESSIRE SCHONBERG is a 51 y.o. 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   1. MDD (major depressive disorder), recurrent, in partial remission ([place]) There has been significant improvement in depressive symptoms since starting bupropion, although the medication was discontinued due to recent possible adverse reaction of SI in the context of some alcohol use.  Psychosocial stressors includes driving to [facility], estranged relationship with her daughter, who lives in [place], loss of her mother in 2013 due to stroke s/p resection of brain tumor, estranged relationship with her father, her brother, and loss of the father of her daughter, who was murdered in 2018.  Will hold bupropion at this time to see if her mood continues to improve with improvement in insomnia as below while acknowledging the possible relapse in her mood symptoms.  Will continue Lexapro to target depression.  Noted that she does not want any medication which can potentially cause weight gain.   2. Insomnia, unspecified type Difficult improvement since starting Ambien.  Will try higher dose to optimize benefit.  Discussed potential risk of drowsiness and sleepwalking.  She was also advised to contact the office if any impulsive SI as she experienced about a week ago; she was also notified of any emergency resources if needed.      Plan Continue lexapro 20 mg daily  Hold bupropion ( was on 150 mg daily)  Increase Ambien 10 mg at night as needed for insomnia Next appointment 11/29 at 4 PM for 30 mins, video   Trazodone (nightmares),     The patient demonstrates the following risk factors for suicide: Chronic risk factors for suicide include: psychiatric disorder of depression . Acute risk factors for suicide include: social withdrawal/isolation and loss (financial, interpersonal, professional). Protective factors for this patient include: coping skills and hope for the future.  Considering these factors, the overall suicide risk at this point appears to be low. Patient is appropriate for outpatient follow up.       Collaboration of Care: Collaboration of Care: Other reviewed notes in Epic  Patient/Guardian was advised Release of Information must be obtained prior to any record release in order to collaborate their care with an outside provider. Patient/Guardian was advised if they have not already done so to contact the registration department to sign all necessary forms in order for [place] to release information regarding their care.   Consent: Patient/Guardian gives verbal consent for treatment and assignment of benefits for services provided during this visit. Patient/Guardian expressed understanding and agreed to proceed.    Norman Clay, MD 11/19/2021, 5:04 PM

## 2021-11-19 ENCOUNTER — Telehealth: Payer: 59 | Admitting: Psychiatry

## 2021-11-19 ENCOUNTER — Encounter: Payer: Self-pay | Admitting: Psychiatry

## 2021-11-19 DIAGNOSIS — G4700 Insomnia, unspecified: Secondary | ICD-10-CM

## 2021-11-19 DIAGNOSIS — F3341 Major depressive disorder, recurrent, in partial remission: Secondary | ICD-10-CM

## 2021-11-19 MED ORDER — ZOLPIDEM TARTRATE 10 MG PO TABS: 10.0000 mg | ORAL_TABLET | Freq: Every evening | ORAL | 0 refills | Status: DC | PRN

## 2021-11-19 NOTE — Patient Instructions (Signed)
Continue lexapro 20 mg daily  Hold bupropion  Increase Ambien 10 mg at night as needed for insomnia Next appointment 11/29 at 4 PM

## 2021-12-01 ENCOUNTER — Ambulatory Visit: Payer: Managed Care, Other (non HMO) | Admitting: Neurology

## 2021-12-08 ENCOUNTER — Other Ambulatory Visit: Payer: Self-pay | Admitting: Internal Medicine

## 2021-12-08 NOTE — Telephone Encounter (Signed)
Requested Prescriptions  Pending Prescriptions Disp Refills   escitalopram (LEXAPRO) 20 MG tablet [Pharmacy Med Name: ESCITALOPRAM TABS 20MG ] 90 tablet 1    Sig: TAKE 1 TABLET DAILY (PLEASE KEEP UPCOMING APPOINTMENT FOR MORE REFILLS)     Psychiatry:  Antidepressants - SSRI Passed - 12/08/2021  1:06 AM      Passed - Completed PHQ-2 or PHQ-9 in the last 360 days      Passed - Valid encounter within last 6 months    Recent Outpatient Visits           2 months ago Essential hypertension   [facility] 12/10/2021, MD   5 months ago Anxiety and depression    [facility] [place], [place], NP   9 months ago Essential hypertension    [facility]a [facility], MD   1 year ago Encounter to establish care   [facility], MD       Future Appointments             In 1 month Marcine Matar Laural Benes, MD [facility]

## 2021-12-13 IMAGING — RF DG UGI W SINGLE CM
10 of 14 series · 16 of 24 positions shown · non-contrast
Comparison: None.

CLINICAL DATA: Morbid obesity. Preoperative evaluation for planned
sleeve gastrectomy.

EXAM:
UPPER GI SERIES WITH KUB
TECHNIQUE: After obtaining a scout radiograph a routine upper GI series was
performed using thin barium.
FLUOROSCOPY TIME:  Fluoroscopy Time: 1 minutes and 0 seconds of
low-dose pulsed fluoroscopy
Radiation Exposure Index (if provided by the fluoroscopic device):
58.2 mGy
Number of Acquired Spot Images: 2 scout images.  Two spot images.

[Series 1: t abdomen supine · 0.15mm/px · 1 of 1 slices shown]
[im 1/1]
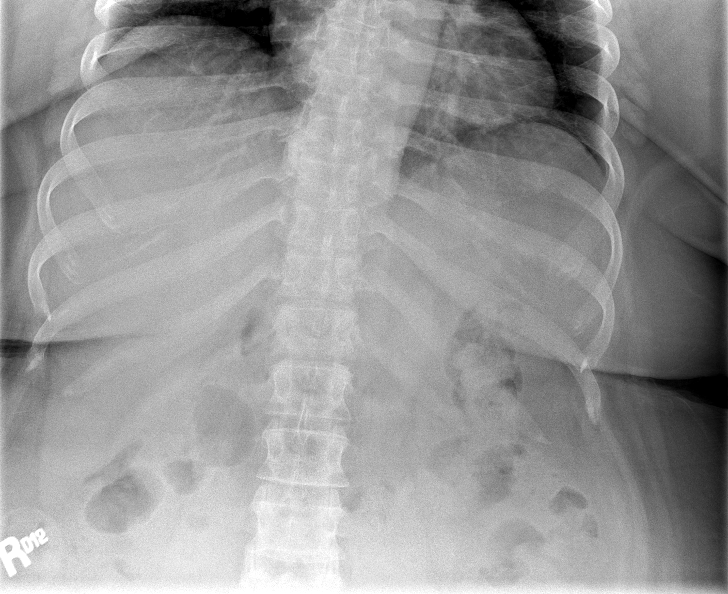

[Series 3: cp_standard · 0.52mm/px · 3 of 49 frames shown (1 of 8)]
[frame 4/49]
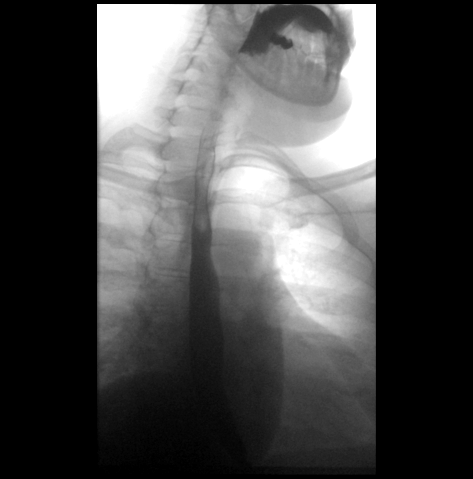
[frame 8/49]
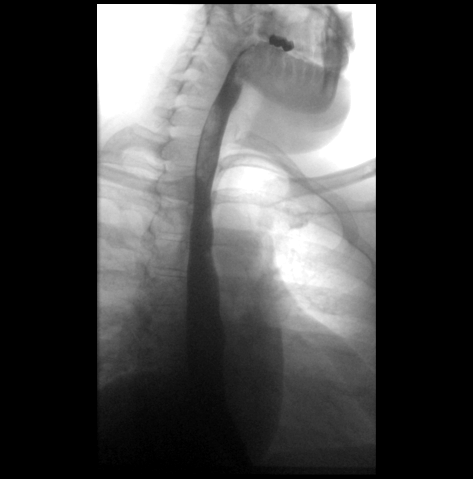
[frame 42/49]
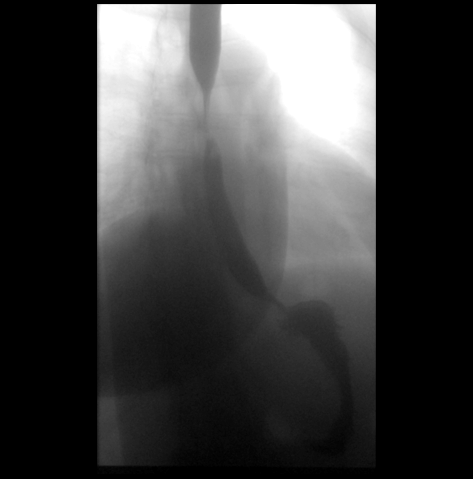

[Series 4: cp_standard · 0.52mm/px · 2 of 12 frames shown (2 of 8)]
[frame 2/12]
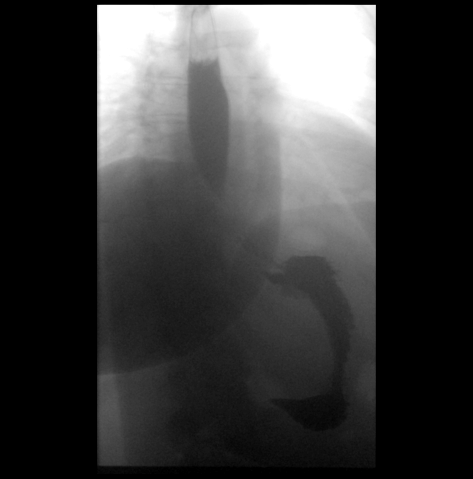
[frame 11/12]
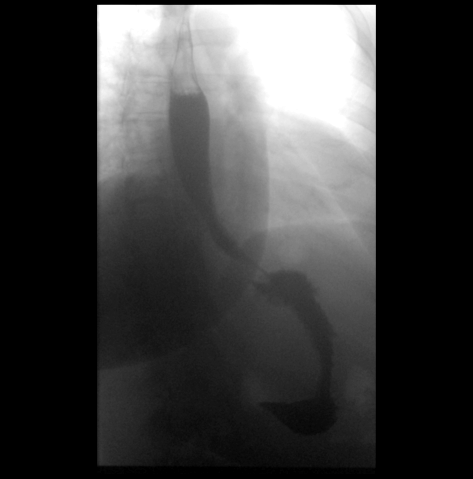

[Series 5: cp_standard · 0.26mm/px · 1 of 1 slices shown (3 of 8)]
[im 1/1]
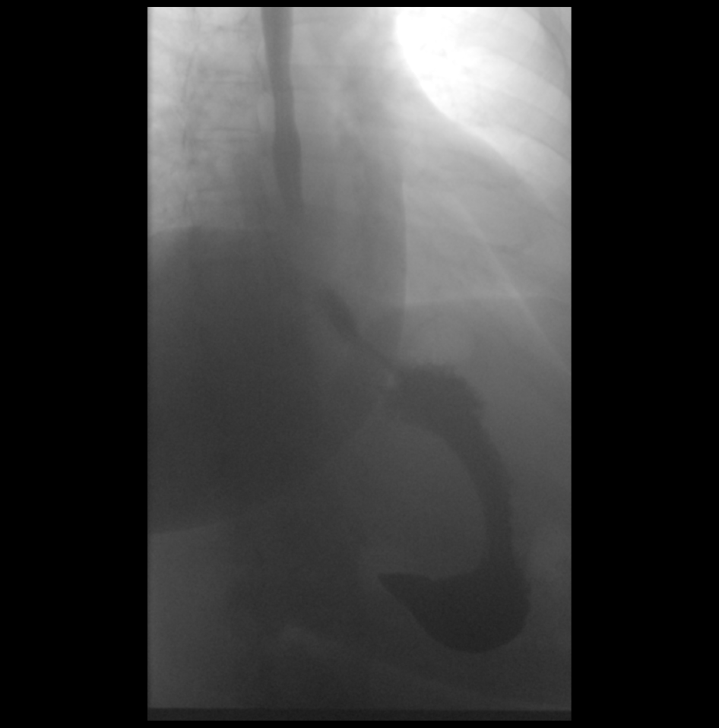

[Series 7: cp_standard · 0.28mm/px · 1 of 1 slices shown (4 of 8)]
[im 1/1]
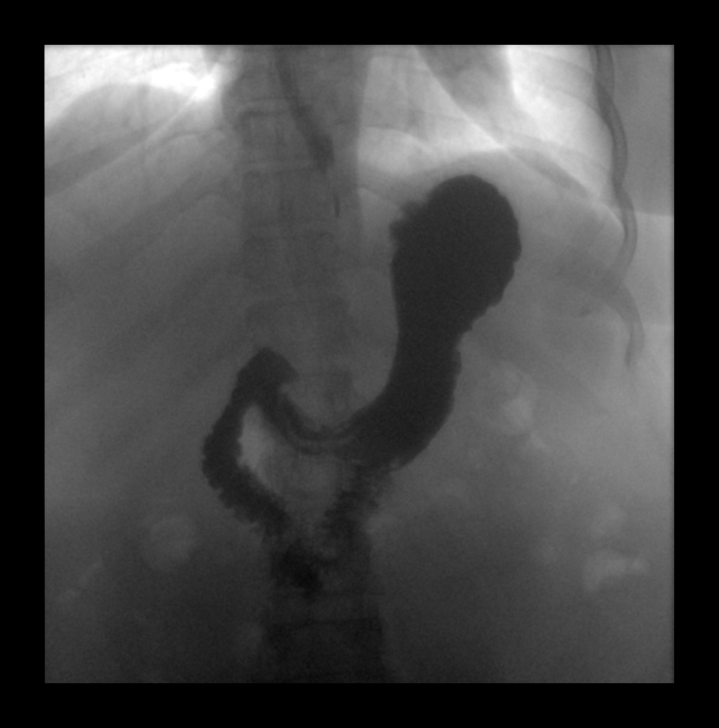

[Series 9: cp_standard · 0.28mm/px · 1 of 1 slices shown (5 of 8)]
[im 1/1]
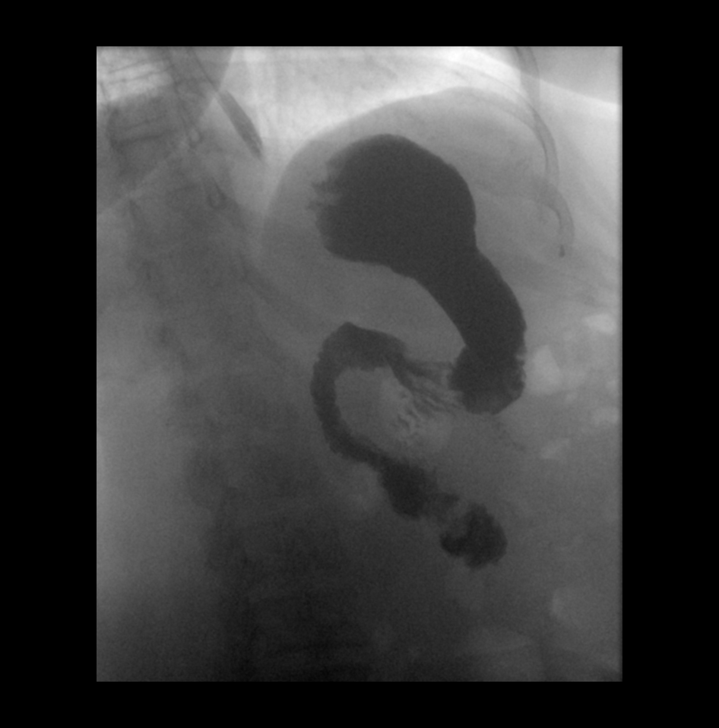

[Series 11: cp_standard · 0.56mm/px · 3 of 44 frames shown (6 of 8)]
[frame 7/44]
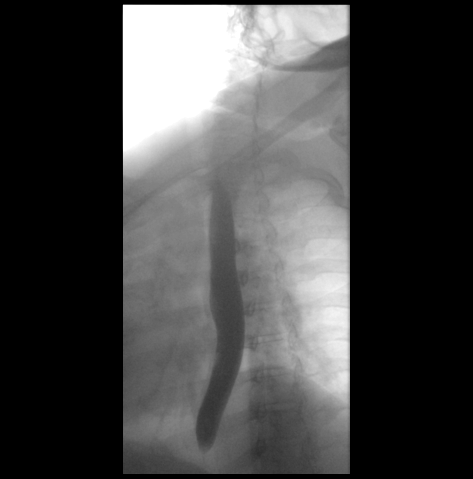
[frame 23/44]
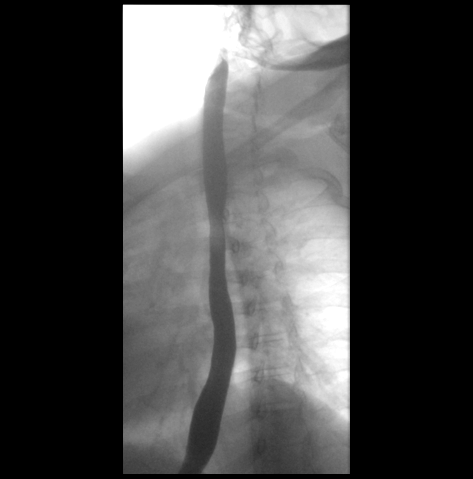
[frame 43/44]
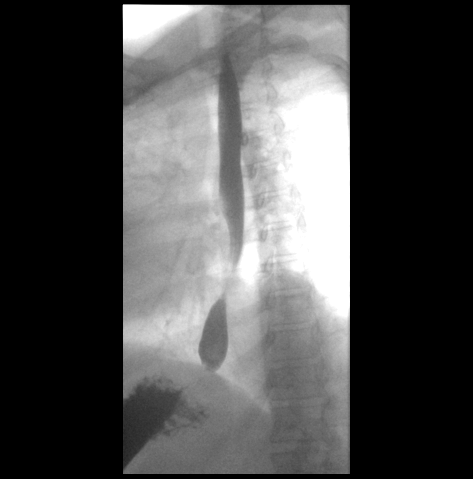

[Series 12: cp_standard · 0.38mm/px · 2 of 28 frames shown (7 of 8)]
[frame 5/28]
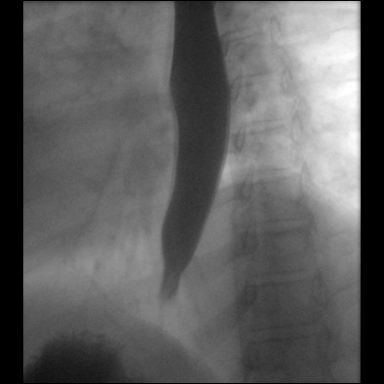
[frame 24/28]
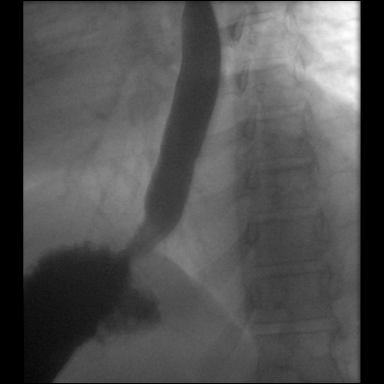

[Series 13: fluoro_barium 2fps_bw · 0.20mm/px · 1 of 1 slices shown]
[im 1/1]
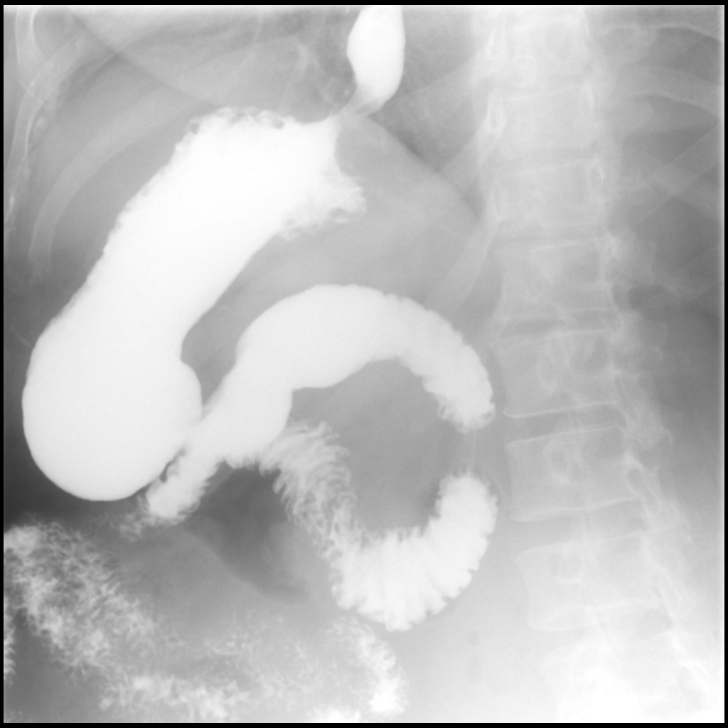

[Series 15: cp_standard · 0.28mm/px · 1 of 1 slices shown (8 of 8)]
[im 1/1]
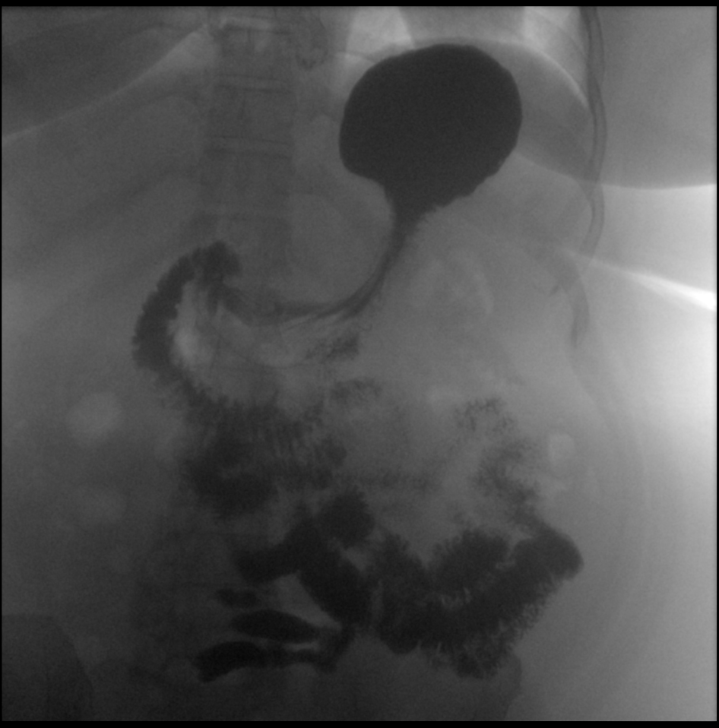

[16 of 24 positions shown; findings below may reference images not displayed]

FINDINGS: The scout abdominal radiograph is unremarkable aside from mild
lumbar spondylosis.

The patient swallowed the barium without difficulty. The esophageal
motility is normal. There is no stricture, mass, ulceration or
significant hiatal hernia.

The stomach and duodenum appear normal. The ligament of Treitz is
normally positioned.
IMPRESSION: Normal upper GI series.
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
## 2021-12-22 ENCOUNTER — Other Ambulatory Visit (HOSPITAL_COMMUNITY): Payer: Self-pay

## 2021-12-22 ENCOUNTER — Emergency Department (HOSPITAL_COMMUNITY)
Admission: EM | Admit: 2021-12-22 | Discharge: 2021-12-22 | Disposition: A | Discharge status: Home / Self Care | Payer: Managed Care, Other (non HMO) | Attending: Emergency Medicine | Admitting: Emergency Medicine

## 2021-12-22 ENCOUNTER — Emergency Department (HOSPITAL_COMMUNITY): Payer: Managed Care, Other (non HMO)

## 2021-12-22 ENCOUNTER — Other Ambulatory Visit: Payer: Self-pay

## 2021-12-22 DIAGNOSIS — M542 Cervicalgia: Secondary | ICD-10-CM | POA: Diagnosis not present

## 2021-12-22 DIAGNOSIS — M545 Low back pain, unspecified: Secondary | ICD-10-CM | POA: Insufficient documentation

## 2021-12-22 DIAGNOSIS — Z7982 Long term (current) use of aspirin: Secondary | ICD-10-CM | POA: Diagnosis not present

## 2021-12-22 DIAGNOSIS — Y92410 Unspecified street and highway as the place of occurrence of the external cause: Secondary | ICD-10-CM | POA: Insufficient documentation

## 2021-12-22 MED ORDER — CYCLOBENZAPRINE HCL 10 MG PO TABS
10.0000 mg | ORAL_TABLET | Freq: Two times a day (BID) | ORAL | 0 refills | Status: AC | PRN
  Filled 2021-12-22: qty 20, 10d supply, fill #0

## 2021-12-22 MED ORDER — IBUPROFEN 600 MG PO TABS
600.0000 mg | ORAL_TABLET | Freq: Four times a day (QID) | ORAL | 0 refills | Status: AC | PRN
  Filled 2021-12-22: qty 12, 3d supply, fill #0
  Filled 2021-12-22: qty 18, 5d supply, fill #0
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
## 2021-12-23 ENCOUNTER — Telehealth (INDEPENDENT_AMBULATORY_CARE_PROVIDER_SITE_OTHER): Payer: 59 | Admitting: Psychiatry

## 2021-12-23 ENCOUNTER — Encounter: Payer: Self-pay | Admitting: Psychiatry

## 2021-12-23 DIAGNOSIS — F330 Major depressive disorder, recurrent, mild: Secondary | ICD-10-CM

## 2021-12-23 DIAGNOSIS — G4700 Insomnia, unspecified: Secondary | ICD-10-CM

## 2021-12-23 MED ORDER — ZOLPIDEM TARTRATE 10 MG PO TABS: 10.0000 mg | ORAL_TABLET | Freq: Every evening | ORAL | 0 refills | Status: DC | PRN

## 2021-12-23 MED ORDER — BUPROPION HCL ER (XL) 150 MG PO TB24: 150.0000 mg | ORAL_TABLET | Freq: Every day | ORAL | 0 refills | Status: DC

## 2021-12-23 NOTE — Patient Instructions (Addendum)
Continue lexapro 20 mg daily  Start bupropion 150 mg daily  Continue Ambien 10 mg at night as needed for insomnia Next appointment 1/4 at 2 PM
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
## 2022-01-27 ENCOUNTER — Encounter: Payer: Self-pay | Admitting: Internal Medicine
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
## 2022-01-28 ENCOUNTER — Encounter: Payer: Self-pay | Admitting: Psychiatry

## 2022-01-28 ENCOUNTER — Telehealth: Payer: 59 | Admitting: Psychiatry

## 2022-01-28 DIAGNOSIS — F330 Major depressive disorder, recurrent, mild: Secondary | ICD-10-CM

## 2022-01-28 DIAGNOSIS — G4700 Insomnia, unspecified: Secondary | ICD-10-CM | POA: Diagnosis not present

## 2022-01-28 MED ORDER — ZOLPIDEM TARTRATE 10 MG PO TABS: 10.0000 mg | ORAL_TABLET | Freq: Every evening | ORAL | 1 refills | Status: DC | PRN

## 2022-01-28 MED ORDER — BUPROPION HCL ER (XL) 150 MG PO TB24: 150.0000 mg | ORAL_TABLET | Freq: Every day | ORAL | 1 refills | Status: DC

## 2022-01-28 NOTE — Patient Instructions (Signed)
Continue lexapro 20 mg daily  Start bupropion 150 mg daily  Continue Ambien 10 mg at night as needed for insomnia Next appointment 2/23 at 11:30

## 2022-02-04 ENCOUNTER — Ambulatory Visit: Payer: Managed Care, Other (non HMO) | Admitting: Internal Medicine
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
## 2022-03-19 ENCOUNTER — Encounter: Payer: Self-pay | Admitting: Psychiatry

## 2022-03-19 ENCOUNTER — Telehealth (INDEPENDENT_AMBULATORY_CARE_PROVIDER_SITE_OTHER): Payer: 59 | Admitting: Psychiatry

## 2022-03-19 DIAGNOSIS — G4700 Insomnia, unspecified: Secondary | ICD-10-CM | POA: Diagnosis not present

## 2022-03-19 DIAGNOSIS — F3341 Major depressive disorder, recurrent, in partial remission: Secondary | ICD-10-CM | POA: Diagnosis not present

## 2022-03-19 MED ORDER — BUPROPION HCL ER (XL) 150 MG PO TB24: 150.0000 mg | ORAL_TABLET | Freq: Every day | ORAL | 0 refills | Status: DC

## 2022-03-19 MED ORDER — ESCITALOPRAM OXALATE 20 MG PO TABS: 20.0000 mg | ORAL_TABLET | Freq: Every day | ORAL | 0 refills | Status: DC

## 2022-03-19 MED ORDER — ZOLPIDEM TARTRATE 10 MG PO TABS: 10.0000 mg | ORAL_TABLET | Freq: Every evening | ORAL | 2 refills | Status: DC | PRN

## 2022-03-19 NOTE — Patient Instructions (Signed)
Continue lexapro 20 mg daily  Continue bupropion 150 mg daily  Continue Ambien 10 mg at night as needed for insomnia Next appointment: 5/22 at 1 pm

## 2022-04-12 ENCOUNTER — Ambulatory Visit: Payer: Managed Care, Other (non HMO) | Admitting: Internal Medicine

## 2022-05-31 ENCOUNTER — Other Ambulatory Visit: Payer: Self-pay | Admitting: Internal Medicine

## 2022-05-31 DIAGNOSIS — E782 Mixed hyperlipidemia: Secondary | ICD-10-CM

## 2022-06-07 ENCOUNTER — Other Ambulatory Visit: Payer: Self-pay | Admitting: Internal Medicine

## 2022-06-07 DIAGNOSIS — I10 Essential (primary) hypertension: Secondary | ICD-10-CM

## 2022-06-07 NOTE — Telephone Encounter (Signed)
Future appt for medication refills needed

## 2022-06-07 NOTE — Telephone Encounter (Signed)
Requested Prescriptions  Pending Prescriptions Disp Refills   escitalopram (LEXAPRO) 20 MG tablet [Pharmacy Med Name: ESCITALOPRAM TABS 20MG ] 90 tablet 3    Sig: TAKE 1 TABLET DAILY (PLEASE KEEP UPCOMING APPOINTMENT FOR MORE REFILLS)     Psychiatry:  Antidepressants - SSRI Failed - 06/07/2022  1:04 AM      Failed - Valid encounter within last 6 months    Recent Outpatient Visits           8 months ago Essential hypertension   [place] [facility] Marcine Matar, MD   11 months ago Anxiety and depression   [place] [facility] [place], Shea Stakes, NP   1 year ago Essential hypertension   [place] [facility] Marcine Matar, MD   1 year ago Encounter to establish care   [place] [facility] Marcine Matar, MD              Passed - Completed PHQ-2 or PHQ-9 in the last 360 days       amLODipine (NORVASC) 10 MG tablet [Pharmacy Med Name: AMLODIPINE BESYLATE TABS 10MG ] 30 tablet 0    Sig: TAKE 1 TABLET DAILY     Cardiovascular: Calcium Channel Blockers 2 Failed - 06/07/2022  1:04 AM      Failed - Last BP in normal range    BP Readings from Last 1 Encounters:  12/22/21 (!) 130/91         Failed - Valid encounter within last 6 months    Recent Outpatient Visits           8 months ago Essential hypertension   [place] [facility] Marcine Matar, MD   11 months ago Anxiety and depression   [place] [facility] [place], Shea Stakes, NP   1 year ago Essential hypertension    [facility] Marcine Matar, MD   1 year ago Encounter to establish care   [facility] Marcine Matar, MD              Passed - Last Heart Rate in normal range    Pulse Readings from Last 1 Encounters:  12/22/21 79         Courtesy refill. Patient will need an  office visit for further refill.

## 2022-06-07 NOTE — Telephone Encounter (Signed)
Requested medications are due for refill today.  yes  Requested medications are on the active medications list.  yes  Last refill. 03/19/2022 #90 0 rf  Future visit scheduled.   no  Notes to clinic.  Rx signed by Bjorn Pippin.    Requested Prescriptions  Pending Prescriptions Disp Refills   escitalopram (LEXAPRO) 20 MG tablet [Pharmacy Med Name: ESCITALOPRAM TABS 20MG ] 90 tablet 3    Sig: TAKE 1 TABLET DAILY (PLEASE KEEP UPCOMING APPOINTMENT FOR MORE REFILLS)     Psychiatry:  Antidepressants - SSRI Failed - 06/07/2022  1:04 AM      Failed - Valid encounter within last 6 months    Recent Outpatient Visits           8 months ago Essential hypertension   [place] [facility] Marcine Matar, MD   11 months ago Anxiety and depression   [place] [facility] [place], Shea Stakes, NP   1 year ago Essential hypertension   [place] [facility] Marcine Matar, MD   1 year ago Encounter to establish care   [place] [facility] Marcine Matar, MD              Passed - Completed PHQ-2 or PHQ-9 in the last 360 days      Signed Prescriptions Disp Refills   amLODipine (NORVASC) 10 MG tablet 30 tablet 0    Sig: TAKE 1 TABLET DAILY     Cardiovascular: Calcium Channel Blockers 2 Failed - 06/07/2022  1:04 AM      Failed - Last BP in normal range    BP Readings from Last 1 Encounters:  12/22/21 (!) 130/91         Failed - Valid encounter within last 6 months    Recent Outpatient Visits           8 months ago Essential hypertension   [place] [facility] Marcine Matar, MD   11 months ago Anxiety and depression   [place] [facility] [place], Shea Stakes, NP   1 year ago Essential hypertension   [place] [facility] Marcine Matar, MD   1 year ago Encounter to establish care   [facility] Marcine Matar, MD              Passed - Last Heart Rate in normal range    Pulse Readings from Last 1 Encounters:  12/22/21 79

## 2022-06-07 NOTE — Telephone Encounter (Signed)
Called pt - left message to return call and schedule appt. 

## 2022-06-10 ENCOUNTER — Other Ambulatory Visit: Payer: Self-pay | Admitting: Psychiatry
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
## 2022-06-16 ENCOUNTER — Telehealth: Payer: Self-pay | Admitting: Psychiatry

## 2022-06-16 ENCOUNTER — Encounter: Payer: 59 | Admitting: Psychiatry

## 2022-06-16 NOTE — Telephone Encounter (Signed)
Sent a video visit link through Epic, but the patient didn't sign in. Tried calling for today's appointment, but got no answer. Left a voicemail instructing the patient to contact the office at [phone].  

## 2022-06-16 NOTE — Progress Notes (Signed)
This encounter was created in error - please disregard.
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
## 2022-07-08 ENCOUNTER — Encounter: Payer: Self-pay | Admitting: Psychiatry

## 2022-07-08 ENCOUNTER — Telehealth (INDEPENDENT_AMBULATORY_CARE_PROVIDER_SITE_OTHER): Payer: 59 | Admitting: Psychiatry

## 2022-07-08 DIAGNOSIS — G4700 Insomnia, unspecified: Secondary | ICD-10-CM

## 2022-07-08 DIAGNOSIS — F3341 Major depressive disorder, recurrent, in partial remission: Secondary | ICD-10-CM

## 2022-07-08 MED ORDER — ESCITALOPRAM OXALATE 20 MG PO TABS: 20.0000 mg | ORAL_TABLET | Freq: Every day | ORAL | 0 refills | Status: DC

## 2022-07-08 MED ORDER — ZOLPIDEM TARTRATE 5 MG PO TABS: 5.0000 mg | ORAL_TABLET | Freq: Every evening | ORAL | 1 refills | Status: DC | PRN
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
## 2022-08-24 ENCOUNTER — Other Ambulatory Visit: Payer: Self-pay | Admitting: Psychiatry

## 2022-08-24 MED ORDER — BUPROPION HCL ER (XL) 150 MG PO TB24: 150.0000 mg | ORAL_TABLET | Freq: Every day | ORAL | 0 refills | Status: DC

## 2022-08-27 ENCOUNTER — Encounter: Payer: Self-pay | Admitting: Psychiatry

## 2022-08-27 ENCOUNTER — Telehealth: Payer: 59 | Admitting: Psychiatry

## 2022-08-27 DIAGNOSIS — G4700 Insomnia, unspecified: Secondary | ICD-10-CM

## 2022-08-27 DIAGNOSIS — F3342 Major depressive disorder, recurrent, in full remission: Secondary | ICD-10-CM

## 2022-08-27 DIAGNOSIS — F419 Anxiety disorder, unspecified: Secondary | ICD-10-CM | POA: Diagnosis not present

## 2022-08-27 MED ORDER — BUPROPION HCL ER (XL) 150 MG PO TB24: 150.0000 mg | ORAL_TABLET | Freq: Every day | ORAL | 0 refills | Status: DC

## 2022-08-27 MED ORDER — ESCITALOPRAM OXALATE 20 MG PO TABS: 20.0000 mg | ORAL_TABLET | Freq: Every day | ORAL | 1 refills | Status: DC

## 2022-08-27 NOTE — Patient Instructions (Signed)
Continue lexapro 20 mg daily  Continue bupropion 150 mg daily  Discontinue Ambien Next appointment: 11/13 at 8 am

## 2022-11-10 ENCOUNTER — Encounter (HOSPITAL_COMMUNITY): Payer: Self-pay | Admitting: *Deleted
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
## 2022-12-08 ENCOUNTER — Telehealth (INDEPENDENT_AMBULATORY_CARE_PROVIDER_SITE_OTHER): Payer: 59 | Admitting: Psychiatry

## 2022-12-08 ENCOUNTER — Encounter: Payer: Self-pay | Admitting: Psychiatry

## 2022-12-08 DIAGNOSIS — G4700 Insomnia, unspecified: Secondary | ICD-10-CM | POA: Diagnosis not present

## 2022-12-08 DIAGNOSIS — F419 Anxiety disorder, unspecified: Secondary | ICD-10-CM | POA: Diagnosis not present

## 2022-12-08 DIAGNOSIS — F3341 Major depressive disorder, recurrent, in partial remission: Secondary | ICD-10-CM

## 2022-12-08 MED ORDER — DOXEPIN HCL 6 MG PO TABS: 6.0000 mg | ORAL_TABLET | Freq: Every evening | ORAL | 1 refills | Status: DC | PRN

## 2022-12-08 NOTE — Patient Instructions (Signed)
Continue lexapro 20 mg daily  Continue bupropion 150 mg daily  Discontinue Ambien  Next appointment: 1/15 at 9 am
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
## 2023-02-09 ENCOUNTER — Encounter: Payer: Self-pay | Admitting: Psychiatry

## 2023-02-09 ENCOUNTER — Telehealth (INDEPENDENT_AMBULATORY_CARE_PROVIDER_SITE_OTHER): Payer: 59 | Admitting: Psychiatry

## 2023-02-09 DIAGNOSIS — F419 Anxiety disorder, unspecified: Secondary | ICD-10-CM

## 2023-02-09 DIAGNOSIS — F3341 Major depressive disorder, recurrent, in partial remission: Secondary | ICD-10-CM | POA: Diagnosis not present

## 2023-02-09 DIAGNOSIS — G4700 Insomnia, unspecified: Secondary | ICD-10-CM | POA: Diagnosis not present

## 2023-02-09 MED ORDER — ESCITALOPRAM OXALATE 20 MG PO TABS: 20.0000 mg | ORAL_TABLET | Freq: Every day | ORAL | 1 refills | Status: AC

## 2023-02-09 MED ORDER — DOXEPIN HCL 6 MG PO TABS: 6.0000 mg | ORAL_TABLET | Freq: Every evening | ORAL | 0 refills | Status: DC | PRN

## 2023-02-09 NOTE — Patient Instructions (Signed)
 Continue lexapro  20 mg daily  Continue doxepin  6 mg at night as needed for insomnia Next appointment: 4/16 at 8 20
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
## 2023-05-11 ENCOUNTER — Encounter: Payer: Self-pay | Admitting: Psychiatry

## 2023-05-11 ENCOUNTER — Telehealth (INDEPENDENT_AMBULATORY_CARE_PROVIDER_SITE_OTHER): Payer: Self-pay | Admitting: Psychiatry

## 2023-05-11 DIAGNOSIS — F419 Anxiety disorder, unspecified: Secondary | ICD-10-CM

## 2023-05-11 DIAGNOSIS — G4700 Insomnia, unspecified: Secondary | ICD-10-CM

## 2023-05-11 DIAGNOSIS — F3341 Major depressive disorder, recurrent, in partial remission: Secondary | ICD-10-CM

## 2023-05-11 MED ORDER — DOXEPIN HCL 6 MG PO TABS: 6.0000 mg | ORAL_TABLET | Freq: Every evening | ORAL | 0 refills | Status: AC | PRN

## 2023-05-11 NOTE — Patient Instructions (Signed)
 Continue lexapro 20 mg daily  Continue doxepin 6 mg at night as needed for insomnia Next appointment: 6/11 at 8 am
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
## 2023-07-06 ENCOUNTER — Telehealth: Admitting: Psychiatry
